# Patient Record
Sex: Female | Born: 1995 | Race: Black or African American | Hispanic: No | Marital: Single | Smoking: Never smoker
Health system: Southern US, Community
[De-identification: ages and names within clinical notes are randomized; demographics above are authoritative.]

## PROBLEM LIST (undated history)

## (undated) DIAGNOSIS — F419 Anxiety disorder, unspecified: Secondary | ICD-10-CM

## (undated) DIAGNOSIS — F32A Depression, unspecified: Secondary | ICD-10-CM

## (undated) DIAGNOSIS — F329 Major depressive disorder, single episode, unspecified: Secondary | ICD-10-CM

---

## 2015-09-04 ENCOUNTER — Emergency Department (HOSPITAL_COMMUNITY)
Admission: EM | Admit: 2015-09-04 | Discharge: 2015-09-04 | Disposition: A | Discharge status: Home / Self Care | Payer: Medicaid - Out of State | Attending: Emergency Medicine | Admitting: Emergency Medicine

## 2015-09-04 ENCOUNTER — Other Ambulatory Visit: Payer: Self-pay

## 2015-09-04 ENCOUNTER — Encounter (HOSPITAL_COMMUNITY): Payer: Self-pay | Admitting: Oncology

## 2015-09-04 DIAGNOSIS — R202 Paresthesia of skin: Secondary | ICD-10-CM | POA: Diagnosis not present

## 2015-09-04 DIAGNOSIS — R064 Hyperventilation: Secondary | ICD-10-CM | POA: Insufficient documentation

## 2015-09-04 DIAGNOSIS — F1210 Cannabis abuse, uncomplicated: Secondary | ICD-10-CM | POA: Diagnosis not present

## 2015-09-04 DIAGNOSIS — F329 Major depressive disorder, single episode, unspecified: Secondary | ICD-10-CM | POA: Insufficient documentation

## 2015-09-04 DIAGNOSIS — R42 Dizziness and giddiness: Secondary | ICD-10-CM | POA: Insufficient documentation

## 2015-09-04 DIAGNOSIS — F438 Other reactions to severe stress: Secondary | ICD-10-CM

## 2015-09-04 DIAGNOSIS — F4320 Adjustment disorder, unspecified: Secondary | ICD-10-CM | POA: Diagnosis not present

## 2015-09-04 DIAGNOSIS — F419 Anxiety disorder, unspecified: Secondary | ICD-10-CM

## 2015-09-04 DIAGNOSIS — Z008 Encounter for other general examination: Secondary | ICD-10-CM | POA: Diagnosis present

## 2015-09-04 LAB — CBC
HEMATOCRIT: 38.4 % (ref 36.0–46.0)
Hemoglobin: 13.1 g/dL (ref 12.0–15.0)
MCH: 30 pg (ref 26.0–34.0)
MCHC: 34.1 g/dL (ref 30.0–36.0)
MCV: 88.1 fL (ref 78.0–100.0)
Platelets: 226 10*3/uL (ref 150–400)
RBC: 4.36 MIL/uL (ref 3.87–5.11)
RDW: 12.5 % (ref 11.5–15.5)
WBC: 4.2 10*3/uL (ref 4.0–10.5)

## 2015-09-04 LAB — RAPID URINE DRUG SCREEN, HOSP PERFORMED
Amphetamines: NOT DETECTED
BARBITURATES: NOT DETECTED
BENZODIAZEPINES: NOT DETECTED
Cocaine: NOT DETECTED
Opiates: NOT DETECTED
Tetrahydrocannabinol: POSITIVE — AB

## 2015-09-04 LAB — COMPREHENSIVE METABOLIC PANEL
ALBUMIN: 4.3 g/dL (ref 3.5–5.0)
ALK PHOS: 41 U/L (ref 38–126)
ALT: 15 U/L (ref 14–54)
AST: 23 U/L (ref 15–41)
Anion gap: 5 (ref 5–15)
BILIRUBIN TOTAL: 0.6 mg/dL (ref 0.3–1.2)
BUN: 18 mg/dL (ref 6–20)
CALCIUM: 9.4 mg/dL (ref 8.9–10.3)
CO2: 25 mmol/L (ref 22–32)
CREATININE: 0.86 mg/dL (ref 0.44–1.00)
Chloride: 106 mmol/L (ref 101–111)
GFR calc Af Amer: >60 mL/min (ref >60)
GLUCOSE: 101 mg/dL — AB (ref 65–99)
Potassium: 4 mmol/L (ref 3.5–5.1)
Sodium: 136 mmol/L (ref 135–145)
TOTAL PROTEIN: 8.1 g/dL (ref 6.5–8.1)

## 2015-09-04 LAB — SALICYLATE LEVEL: Salicylate Lvl: <4 mg/dL (ref 2.8–30.0)

## 2015-09-04 LAB — ACETAMINOPHEN LEVEL: Acetaminophen (Tylenol), Serum: <10 ug/mL — ABNORMAL LOW (ref 10–30)

## 2015-09-04 LAB — ETHANOL
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
## 2015-09-04 NOTE — ED Notes (Signed)
Pt brought in by [employer] for medical clearance as pt is experiencing family stressors.  Pt reports that family is not supportive in many ways.  Denies SI/HI.  Pt is tearful in triage and reluctant to talk.  Per [employer] pt was hyperventilating in her dorm room and counselor suggested pt be brought in for psyc evaluation.

## 2015-09-04 NOTE — BHH Counselor (Signed)
Disposition: Per Hulan FessIjeoma Nwaeze, NP, Pt does not meet inpt criteria. Pt can be d/c with OP resources. Pt provided w/ info for [facility] and she says she is open to seeking tx.  Disposition shared with Earley FavorGail Schulz, NP and she is in agreement.   Cyndie MullAnna Scout Guyett, [facility] Triage Specialist

## 2015-09-04 NOTE — ED Provider Notes (Signed)
CSN: [id number]   Arrival date & time 09/04/15 1947  History  By signing my name below, I, Amber York, attest that this documentation has been prepared under the direction and in the presence of Earley Favor, FNP. Electronically Signed: Bethel York, ED Scribe. 09/04/2015. 10:02 PM. Chief Complaint  Patient presents with  . Medical Clearance    HPI The history is provided by the patient. No language interpreter was used.   Amber York is a 19 y.o. female who presents to the Emergency Department complaining of an anxiety attack today. Pt states that she woke up feeling dizzy with tingling in the hands/arms. She was found hyperventilating in her room by EMS. The episode lasted for 1.5 hours. She states that during the episode she was crying because she did not know what was going on. She spoke with a physician at the [employer] [facility] who referred her to the ED. She is in college and this is her first time away from home. Pt states "I don't like being away from home". She states that her family is frequently unavailable or unwilling to talk to her. Pt does not have friend's at school and is not in contact with friend's from home.  History reviewed. No pertinent past medical history.  History reviewed. No pertinent past surgical history.  No family history on file.  Social History  Substance Use Topics  . Smoking status: Never Smoker   . Smokeless tobacco: Never Used  . Alcohol Use: No     Review of Systems  Constitutional: Negative for fever and appetite change.  Respiratory: Negative for shortness of breath.   Cardiovascular: Negative for chest pain.  Musculoskeletal: Negative for arthralgias.  Neurological: Positive for dizziness. Negative for weakness and numbness.  Psychiatric/Behavioral: The patient is nervous/anxious.   All other systems reviewed and are negative.   Home Medications   Prior to Admission medications   Not on File    Allergies  Wheat bran and  Peanuts  Triage Vitals: BP 114/59 mmHg  Pulse 64  Temp(Src) 98.5 F (36.9 C) (Oral)  Resp 18  Ht  (1.676 m)  Wt 151 lb (68.493 kg)  BMI 24.38 kg/m2  SpO2 99%  LMP 09/02/2015 (Exact Date)  Physical Exam  Constitutional: She is oriented to person, place, and time. She appears well-developed and well-nourished. No distress.  Eyes: Pupils are equal, round, and reactive to light.  Neck: Normal range of motion.  Cardiovascular: Normal rate and regular rhythm.   Pulmonary/Chest: Effort normal.  Neurological: She is alert and oriented to person, place, and time.  Skin: Skin is warm.  Psychiatric: Her speech is normal. Judgment and thought content normal. Her mood appears anxious. Cognition and memory are normal. She exhibits a depressed mood.  Nursing note and vitals reviewed.   ED Course  Procedures  DIAGNOSTIC STUDIES: Oxygen Saturation is 99% on RA,  normal by my interpretation.    COORDINATION OF CARE: 10:01 PM Discussed treatment plan which includes lab work and TTS consult with pt at bedside and pt agreed to the plan.  Labs Review-  Labs Reviewed  COMPREHENSIVE METABOLIC PANEL - Abnormal; Notable for the following:    Glucose, Bld 101 (*)    All other components within normal limits  ACETAMINOPHEN LEVEL - Abnormal; Notable for the following:    Acetaminophen (Tylenol), Serum <10 (*)    All other components within normal limits  ETHANOL  SALICYLATE LEVEL  CBC  URINE RAPID DRUG SCREEN, HOSP PERFORMED  Imaging Review No results found.  MDM  Patient is homesick with very little contact with family in [place] states money is very tight and does not think she will be able to travel home for the holidays  Has been assessed by TTS and given outpatient resources I up dated Dr. Micki RileyBarnet as to plan she was most appreciative.  Final diagnoses:  None    I personally performed the services described in this documentation, which was scribed in my presence. The  recorded information has been reviewed and is accurate.     Earley FavorGail Antonios Ostrow, NP 09/04/15 2214  Earley FavorGail Atalia Litzinger, NP 09/04/15 [phone]  Doug SouSam Jacubowitz, MD 09/04/15 2325

## 2015-09-04 NOTE — Discharge Instructions (Signed)
Panic Attacks Panic attacks are sudden, short feelings of great fear or discomfort. You may have them for no reason when you are relaxed, when you are uneasy (anxious), or when you are sleeping.  HOME CARE  Take all your medicines as told.  Check with your doctor before starting new medicines.  Keep all doctor visits. GET HELP IF:  You are not able to take your medicines as told.  Your symptoms do not get better.  Your symptoms get worse. GET HELP RIGHT AWAY IF:  Your attacks seem different than your normal attacks.  You have thoughts about hurting yourself or others.  You take panic attack medicine and you have a side effect. MAKE SURE YOU:  Understand these instructions.  Will watch your condition.  Will get help right away if you are not doing well or get worse.   This information is not intended to replace advice given to you by your health care provider. Make sure you discuss any questions you have with your health care provider.   Document Released: 12/03/2010 Document Revised: 08/21/2013 Document Reviewed: 06/14/2013 Elsevier Interactive Patient Education [employer]vier Inc. You have been given a number of resources to follow up with at your school

## 2015-09-04 NOTE — ED Notes (Signed)
Dr. Electa SniffBarnett from [employer] would like to be updated on pt if possible.  She can be reached at [phone].

## 2017-02-23 ENCOUNTER — Encounter (HOSPITAL_COMMUNITY): Payer: Self-pay | Admitting: Emergency Medicine

## 2017-02-23 ENCOUNTER — Emergency Department (HOSPITAL_COMMUNITY)
Admission: EM | Admit: 2017-02-23 | Discharge: 2017-02-24 | Disposition: A | Discharge status: Other Acute Inpatient Hospital | Payer: Medicaid - Out of State | Attending: Emergency Medicine | Admitting: Emergency Medicine

## 2017-02-23 DIAGNOSIS — Z79899 Other long term (current) drug therapy: Secondary | ICD-10-CM | POA: Insufficient documentation

## 2017-02-23 DIAGNOSIS — F322 Major depressive disorder, single episode, severe without psychotic features: Secondary | ICD-10-CM | POA: Diagnosis present

## 2017-02-23 DIAGNOSIS — R45851 Suicidal ideations: Secondary | ICD-10-CM | POA: Diagnosis present

## 2017-02-23 DIAGNOSIS — Z91010 Allergy to peanuts: Secondary | ICD-10-CM | POA: Insufficient documentation

## 2017-02-23 HISTORY — DX: Anxiety disorder, unspecified: F41.9

## 2017-02-23 HISTORY — DX: Depression, unspecified: F32.A

## 2017-02-23 HISTORY — DX: Major depressive disorder, single episode, unspecified: F32.9

## 2017-02-23 LAB — I-STAT BETA HCG BLOOD, ED (MC, WL, AP ONLY)

## 2017-02-23 LAB — COMPREHENSIVE METABOLIC PANEL
ALBUMIN: 4.2 g/dL (ref 3.5–5.0)
ALT: 11 U/L — ABNORMAL LOW (ref 14–54)
ANION GAP: 6 (ref 5–15)
AST: 19 U/L (ref 15–41)
Alkaline Phosphatase: 54 U/L (ref 38–126)
BILIRUBIN TOTAL: 0.6 mg/dL (ref 0.3–1.2)
BUN: 9 mg/dL (ref 6–20)
CO2: 24 mmol/L (ref 22–32)
Calcium: 9.2 mg/dL (ref 8.9–10.3)
Chloride: 106 mmol/L (ref 101–111)
Creatinine, Ser: 0.76 mg/dL (ref 0.44–1.00)
GFR calc non Af Amer: >60 mL/min (ref >60)
GLUCOSE: 93 mg/dL (ref 65–99)
POTASSIUM: 3.6 mmol/L (ref 3.5–5.1)
SODIUM: 136 mmol/L (ref 135–145)
TOTAL PROTEIN: 7.7 g/dL (ref 6.5–8.1)

## 2017-02-23 LAB — RAPID URINE DRUG SCREEN, HOSP PERFORMED
AMPHETAMINES: NOT DETECTED
BARBITURATES: NOT DETECTED
Benzodiazepines: NOT DETECTED
Cocaine: NOT DETECTED
Opiates: NOT DETECTED
TETRAHYDROCANNABINOL: NOT DETECTED

## 2017-02-23 LAB — CBC
HEMATOCRIT: 35.7 % — AB (ref 36.0–46.0)
Hemoglobin: 11.8 g/dL — ABNORMAL LOW (ref 12.0–15.0)
MCH: 28.7 pg (ref 26.0–34.0)
MCHC: 33.1 g/dL (ref 30.0–36.0)
MCV: 86.9 fL (ref 78.0–100.0)
PLATELETS: 222 10*3/uL (ref 150–400)
RBC: 4.11 MIL/uL (ref 3.87–5.11)
RDW: 13.8 % (ref 11.5–15.5)
WBC: 4.8 10*3/uL (ref 4.0–10.5)

## 2017-02-23 LAB — ACETAMINOPHEN LEVEL: Acetaminophen (Tylenol), Serum: <10 ug/mL — ABNORMAL LOW (ref 10–30)

## 2017-02-23 LAB — SALICYLATE LEVEL

## 2017-02-23 LAB — ETHANOL: Alcohol, Ethyl (B): <5 mg/dL (ref <5)

## 2017-02-23 NOTE — ED Triage Notes (Signed)
Pt states she went to the mental health center at school and they advised her to come to the hospital. Pt states she was having suicidal ideations with no specific plan. "I feel like I just don't want to be here anymore". 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 Pt currently living in a dorm on campus. Pt denies any history of harming herself. Pt tearful during interview. Pt states she does not want any of her family members notified that she is in the hospital. Pt states she sees the counselor at the university every other week, but it is just not helping. Pt states she had no choice in coming to the hospital and she doesn't want to take any medicine.

## 2017-02-23 NOTE — ED Provider Notes (Signed)
[facility]-EMERGENCY DEPT Provider Note   CSN: [id number] Arrival date & time: 02/23/17  1932     History   Chief Complaint Chief Complaint  Patient presents with  . Medical Clearance    HPI Amber York is a 21 y.o. female.  The history is provided by the patient.  Mental Health Problem  Presenting symptoms: depression (1 year) and suicidal thoughts (without plan for 1 week)   Presenting symptoms: no suicidal threats and no suicide attempt   Degree of incapacity (severity):  Moderate Onset quality:  Gradual Timing:  Constant Progression:  Unchanged Chronicity:  New Context: not medication   Treatment compliance:  Untreated Relieved by:  Nothing Worsened by:  Nothing Ineffective treatments: achool counseling. Risk factors: no hx of mental illness and no recent psychiatric admission     Past Medical History:  Diagnosis Date  . Anxiety   . Depression     There are no active problems to display for this patient.   History reviewed. No pertinent surgical history.  OB History    No data available       Home Medications    Prior to Admission medications   Medication Sig Start Date End Date Taking? Authorizing Provider  cetirizine (ZYRTEC) 10 MG tablet Take 10 mg by mouth daily.   Yes Historical Provider, MD  hydrocortisone cream 0.5 % Apply 1 application topically 2 (two) times daily.   Yes Historical Provider, MD  triamcinolone (NASACORT) 55 MCG/ACT AERO nasal inhaler Place 2 sprays into the nose daily.   Yes Historical Provider, MD    Family History History reviewed. No pertinent family history.  Social History Social History  Substance Use Topics  . Smoking status: Never Smoker  . Smokeless tobacco: Never Used  . Alcohol use No     Allergies   Wheat bran; Other; and Peanuts [peanut oil]   Review of Systems Review of Systems  Psychiatric/Behavioral: Positive for suicidal ideas (without plan for 1 week).  All other systems reviewed and are  negative.    Physical Exam Updated Vital Signs BP 124/63 (BP Location: Right Arm)   Pulse 98   Temp 98.5 F (36.9 C) (Oral)   Resp 20   Ht  (1.676 m)   Wt 153 lb (69.4 kg)   LMP 01/29/2017   SpO2 99%   BMI 24.69 kg/m   Physical Exam  Constitutional: She is oriented to person, place, and time. She appears well-developed and well-nourished. No distress.  HENT:  Head: Normocephalic.  Nose: Nose normal.  Eyes: Conjunctivae are normal.  Neck: Neck supple. No tracheal deviation present.  Cardiovascular: Normal rate, regular rhythm and normal heart sounds.   Pulmonary/Chest: Effort normal and breath sounds normal. No respiratory distress.  Abdominal: Soft. She exhibits no distension.  Neurological: She is alert and oriented to person, place, and time.  Skin: Skin is warm and dry.  Psychiatric: She has a normal mood and affect.  Vitals reviewed.    ED Treatments / Results  Labs (all labs ordered are listed, but only abnormal results are displayed) Labs Reviewed  CBC - Abnormal; Notable for the following:       Result Value   Hemoglobin 11.8 (*)    HCT 35.7 (*)    All other components within normal limits  RAPID URINE DRUG SCREEN, HOSP PERFORMED  COMPREHENSIVE METABOLIC PANEL  ETHANOL  SALICYLATE LEVEL  ACETAMINOPHEN LEVEL  I-STAT BETA HCG BLOOD, ED ([facility], [facility], [facility] ONLY)    EKG  EKG  Interpretation None       Radiology No results found.  Procedures Procedures (including critical care time)  Medications Ordered in ED Medications - No data to display   Initial Impression / Assessment and Plan / ED Course  I have reviewed the triage vital signs and the nursing notes.  Pertinent labs & imaging results that were available during my care of the patient were reviewed by me and considered in my medical decision making (see chart for details).     21 year old female presents with suicidal thoughts over the last week. She does not have a plan. She has no  formal psychiatric history. 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 She has been doing outpatient therapy sessions with her school counselor and that has not been helping. She states that she has had about a year of depression symptoms but this seems to be worsening. TTS consulted to evaluate for safety. MEDICALLY CLEAR FOR TRANSFER OR PSYCHIATRIC ADMISSION.   Final Clinical Impressions(s) / ED Diagnoses   Final diagnoses:  Suicidal ideation    New Prescriptions New Prescriptions   No medications on file     Lyndal Pulley, MD 02/23/17 2117
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
## 2017-02-24 ENCOUNTER — Encounter (HOSPITAL_COMMUNITY): Payer: Self-pay | Admitting: *Deleted

## 2017-02-24 ENCOUNTER — Inpatient Hospital Stay (HOSPITAL_COMMUNITY)
Admission: AD | Admit: 2017-02-24 | Discharge: 2017-03-02 | DRG: 881 | Disposition: A | Discharge status: Home / Self Care | Payer: Medicaid - Out of State | Source: Intra-hospital | Attending: Psychiatry | Admitting: Psychiatry

## 2017-02-24 DIAGNOSIS — Z91018 Allergy to other foods: Secondary | ICD-10-CM

## 2017-02-24 DIAGNOSIS — Z598 Other problems related to housing and economic circumstances: Secondary | ICD-10-CM

## 2017-02-24 DIAGNOSIS — F329 Major depressive disorder, single episode, unspecified: Secondary | ICD-10-CM | POA: Diagnosis present

## 2017-02-24 DIAGNOSIS — R45851 Suicidal ideations: Secondary | ICD-10-CM | POA: Diagnosis present

## 2017-02-24 DIAGNOSIS — G4700 Insomnia, unspecified: Secondary | ICD-10-CM | POA: Diagnosis present

## 2017-02-24 DIAGNOSIS — Z818 Family history of other mental and behavioral disorders: Secondary | ICD-10-CM

## 2017-02-24 DIAGNOSIS — Z560 Unemployment, unspecified: Secondary | ICD-10-CM

## 2017-02-24 DIAGNOSIS — J302 Other seasonal allergic rhinitis: Secondary | ICD-10-CM | POA: Diagnosis present

## 2017-02-24 DIAGNOSIS — F322 Major depressive disorder, single episode, severe without psychotic features: Secondary | ICD-10-CM | POA: Diagnosis not present

## 2017-02-24 DIAGNOSIS — Z91010 Allergy to peanuts: Secondary | ICD-10-CM

## 2017-02-24 DIAGNOSIS — Z79899 Other long term (current) drug therapy: Secondary | ICD-10-CM | POA: Diagnosis not present

## 2017-02-24 DIAGNOSIS — F410 Panic disorder [episodic paroxysmal anxiety] without agoraphobia: Secondary | ICD-10-CM | POA: Diagnosis present

## 2017-02-24 MED ORDER — TRAZODONE HCL 50 MG PO TABS
50.0000 mg | ORAL_TABLET | Freq: Every evening | ORAL | Status: DC | PRN
  Administered 2017-02-28: 50 mg via ORAL
  Filled 2017-02-24: qty 1
  Filled 2017-02-24: qty 7

## 2017-02-24 MED ORDER — ALUM & MAG HYDROXIDE-SIMETH 200-200-20 MG/5ML PO SUSP: 30.0000 mL | ORAL | Status: DC | PRN

## 2017-02-24 MED ORDER — CITALOPRAM HYDROBROMIDE 10 MG PO TABS
10.0000 mg | ORAL_TABLET | Freq: Every day | ORAL | Status: DC
Start: 2017-02-24 — End: 2017-02-24
  Administered 2017-02-24: 10 mg via ORAL
  Filled 2017-02-24: qty 1

## 2017-02-24 MED ORDER — CITALOPRAM HYDROBROMIDE 10 MG PO TABS
10.0000 mg | ORAL_TABLET | Freq: Every day | ORAL | Status: DC
  Filled 2017-02-24 (×3): qty 1

## 2017-02-24 MED ORDER — MAGNESIUM HYDROXIDE 400 MG/5ML PO SUSP: 30.0000 mL | Freq: Every day | ORAL | Status: DC | PRN

## 2017-02-24 MED ORDER — ACETAMINOPHEN 325 MG PO TABS
650.0000 mg | ORAL_TABLET | Freq: Four times a day (QID) | ORAL | Status: DC | PRN
  Administered 2017-02-26 – 2017-02-27 (×2): 650 mg via ORAL
  Filled 2017-02-24 (×2): qty 2

## 2017-02-24 NOTE — ED Notes (Signed)
Attempted to call nursing report to [facility] Adult unit. Reports nurse will call back,.

## 2017-02-24 NOTE — ED Notes (Signed)
[facility] transport on unit to transfer pt to [facility] Adult unit per MD order. Pt signed for personal property and property given to [facility] transport for transfer. Pt signed e-signature. Ambulatory off unit. 

## 2017-02-24 NOTE — ED Notes (Signed)
Pt admitted to room #39. Pt presents with restricted affect, forwards little with this nurse. Pt endorsing passive SI, pt verbally contracts for safety. Pt denies HI. Denies AVH. "I haven't been feeling well." Pt tearful at times. Encourgement and support provided. Special checks q 15 mins in place for safety. Video monitoring in place. Will continue to monitor.

## 2017-02-24 NOTE — Progress Notes (Signed)
02/24/17 1258:  LRT went to pt room to offer activities.  Pt was tearful and not very social.  Pt stated she wanted some coloring sheets and word searches.  LRT printed out the word searches and coloring sheets for pt and gave them to her.  Caroll Rancher, LRT/CTRS
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
## 2017-02-24 NOTE — ED Notes (Signed)
Pt resting comfortably at this time. NAD noted.  

## 2017-02-24 NOTE — BH Assessment (Signed)
[facility] Assessment Progress Note  Per Thedore Mins, MD, this pt requires psychiatric hospitalization at this time.  Malva Limes, RN, [facility] has tentatively assigned pt to [facility] Rm 405-1; she will call with updates when they are ready to receive pt.  Pt has signed Voluntary Admission and Consent for Treatment, as well as Consent to Release Information to the [facility] [employer] [facility], and a notification call has been placed.  Signed forms have been faxed to [facility].  Pt's nurse has been notified, and agrees to send original paperwork along with pt via Juel Burrow, and to call report to [phone] when the time comes.  Doylene Canning, MA Triage Specialist [phone]

## 2017-02-24 NOTE — Progress Notes (Signed)
  Amber York did not attend wrap up group.

## 2017-02-24 NOTE — ED Notes (Signed)
[facility] called for transport. 

## 2017-02-24 NOTE — Consult Note (Signed)
[facility] Face-to-Face Psychiatry Consult   Reason for Consult:  Depression, wrote a suicide note Referring Physician:  EDP Patient Identification: Amber York MRN:  [id number] Principal Diagnosis: Major depressive disorder, single episode, severe without psychotic features [facility]) Diagnosis:   Patient Active Problem List   Diagnosis Date Noted  . Major depressive disorder, single episode, severe without psychotic features ([place]) [F32.2] 02/24/2017    Total Time spent with patient: 30 minutes  Subjective:   Amber York is a 21 y.o. female patient admitted with worsening depression.  HPI:  Amber York, 21 yo female, single, [employer] student had been depressed for a year.  She first began seeing a counselor at school Jan of this year.  She minimized her depression , feelings of hopelessness, attributing depression from stressors at school but otherwise non specific and vague stressors.  She wrote a text to her boyfriend that "I won't be living much loner."  Also, room mates began seeing a change in her.    When she was seen this am, she deferred on taking anti depressants and thinking that she did not need them.  She was also less receptive when she was told that inpatient stabilization would be of great benefit to her.    Past Psychiatric History:  See HPI  Risk to Self: Suicidal Ideation: Yes-Currently Present Suicidal Intent: No Is patient at risk for suicide?: Yes Suicidal Plan?: No Access to Means: No What has been your use of drugs/alcohol within the last 12 months?: Pt denies How many times?: 0 Other Self Harm Risks: None identified Triggers for Past Attempts: None known Intentional Self Injurious Behavior: None Risk to Others: Homicidal Ideation: No Thoughts of Harm to Others: No Current Homicidal Intent: No Current Homicidal Plan: No Access to Homicidal Means: No Identified Victim: None History of harm to others?: No Assessment of Violence: None Noted Violent Behavior  Description: Pt denies history of violence Does patient have access to weapons?: No Criminal Charges Pending?: No Does patient have a court date: No Prior Inpatient Therapy: Prior Inpatient Therapy: No Prior Therapy Dates: NA Prior Therapy Facilty/Provider(s): NA Reason for Treatment: NA Prior Outpatient Therapy: Prior Outpatient Therapy: Yes Prior Therapy Dates: Current Prior Therapy Facilty/Provider(s): [facility] Prescott Parma at [facility] Reason for Treatment: Depression Does patient have an ACCT team?: No Does patient have Intensive In-House Services?  : No Does patient have [facility] services? : No Does patient have P4CC services?: No  Past Medical History:  Past Medical History:  Diagnosis Date  . Anxiety   . Depression    History reviewed. No pertinent surgical history. Family History: History reviewed. No pertinent family history. Family Psychiatric  History: see HPI Social History:  History  Alcohol Use No     History  Drug Use No    Social History   Social History  . Marital status: Single    Spouse name: N/A  . Number of children: N/A  . Years of education: N/A   Social History Main Topics  . Smoking status: Never Smoker  . Smokeless tobacco: Never Used  . Alcohol use No  . Drug use: No  . Sexual activity: No   Other Topics Concern  . None   Social History Narrative  . None   Additional Social History:    Allergies:   Allergies  Allergen Reactions  . Wheat Bran Hives and Shortness Of Breath  . Other     Pea's, hives/sob  . Peanuts [Peanut Oil] Hives    Labs:  Results  for orders placed or performed during the hospital encounter of 02/23/17 (from the past 48 hour(s))  Comprehensive metabolic panel     Status: Abnormal   Collection Time: 02/23/17  8:18 PM  Result Value Ref Range   Sodium 136 135 - 145 mmol/L   Potassium 3.6 3.5 - 5.1 mmol/L   Chloride 106 101 - 111 mmol/L   CO2 24 22 - 32 mmol/L   Glucose, Bld 93 65 - 99 mg/dL   BUN 9 6 - 20  mg/dL   Creatinine, Ser 0.76 0.44 - 1.00 mg/dL   Calcium 9.2 8.9 - 10.3 mg/dL   Total Protein 7.7 6.5 - 8.1 g/dL   Albumin 4.2 3.5 - 5.0 g/dL   AST 19 15 - 41 U/L   ALT 11 (L) 14 - 54 U/L   Alkaline Phosphatase 54 38 - 126 U/L   Total Bilirubin 0.6 0.3 - 1.2 mg/dL   GFR calc non Af Amer >60 >60 mL/min   GFR calc Af Amer >60 >60 mL/min    Comment: (NOTE) The eGFR has been calculated using the CKD EPI equation. This calculation has not been validated in all clinical situations. eGFR's persistently <60 mL/min signify possible Chronic Kidney Disease.    Anion gap 6 5 - 15  Ethanol     Status: None   Collection Time: 02/23/17  8:18 PM  Result Value Ref Range   Alcohol, Ethyl (B) <5 <5 mg/dL    Comment:        LOWEST DETECTABLE LIMIT FOR SERUM ALCOHOL IS 5 mg/dL FOR MEDICAL PURPOSES ONLY   Salicylate level     Status: None   Collection Time: 02/23/17  8:18 PM  Result Value Ref Range   Salicylate Lvl <3.2 2.8 - 30.0 mg/dL  Acetaminophen level     Status: Abnormal   Collection Time: 02/23/17  8:18 PM  Result Value Ref Range   Acetaminophen (Tylenol), Serum <10 (L) 10 - 30 ug/mL    Comment:        THERAPEUTIC CONCENTRATIONS VARY SIGNIFICANTLY. A RANGE OF 10-30 ug/mL MAY BE AN EFFECTIVE CONCENTRATION FOR MANY PATIENTS. HOWEVER, SOME ARE BEST TREATED AT CONCENTRATIONS OUTSIDE THIS RANGE. ACETAMINOPHEN CONCENTRATIONS >150 ug/mL AT 4 HOURS AFTER INGESTION AND >50 ug/mL AT 12 HOURS AFTER INGESTION ARE OFTEN ASSOCIATED WITH TOXIC REACTIONS.   cbc     Status: Abnormal   Collection Time: 02/23/17  8:18 PM  Result Value Ref Range   WBC 4.8 4.0 - 10.5 K/uL   RBC 4.11 3.87 - 5.11 MIL/uL   Hemoglobin 11.8 (L) 12.0 - 15.0 g/dL   HCT 35.7 (L) 36.0 - 46.0 %   MCV 86.9 78.0 - 100.0 fL   MCH 28.7 26.0 - 34.0 pg   MCHC 33.1 30.0 - 36.0 g/dL   RDW 13.8 11.5 - 15.5 %   Platelets 222 150 - 400 K/uL  Rapid urine drug screen (hospital performed)     Status: None   Collection Time:  02/23/17  8:18 PM  Result Value Ref Range   Opiates NONE DETECTED NONE DETECTED   Cocaine NONE DETECTED NONE DETECTED   Benzodiazepines NONE DETECTED NONE DETECTED   Amphetamines NONE DETECTED NONE DETECTED   Tetrahydrocannabinol NONE DETECTED NONE DETECTED   Barbiturates NONE DETECTED NONE DETECTED    Comment:        DRUG SCREEN FOR MEDICAL PURPOSES ONLY.  IF CONFIRMATION IS NEEDED FOR ANY PURPOSE, NOTIFY LAB WITHIN 5 DAYS.  LOWEST DETECTABLE LIMITS FOR URINE DRUG SCREEN Drug Class       Cutoff (ng/mL) Amphetamine      1000 Barbiturate      200 Benzodiazepine   250 Tricyclics       037 Opiates          300 Cocaine          300 THC              50   I-Stat Beta hCG blood, ED ([facility], [facility], [facility] only)     Status: None   Collection Time: 02/23/17  8:34 PM  Result Value Ref Range   I-stat hCG, quantitative <5.0 <5 mIU/mL   Comment 3            Comment:   GEST. AGE      CONC.  (mIU/mL)   <=1 WEEK        5 - 50     2 WEEKS       50 - 500     3 WEEKS       100 - 10,000     4 WEEKS     1,000 - 30,000        FEMALE AND NON-PREGNANT FEMALE:     LESS THAN 5 mIU/mL     Current Facility-Administered Medications  Medication Dose Route Frequency Provider Last Rate Last Dose  . citalopram (CELEXA) tablet 10 mg  10 mg Oral Daily Corena Pilgrim, MD   10 mg at 02/24/17 1244   Current Outpatient Prescriptions  Medication Sig Dispense Refill  . cetirizine (ZYRTEC) 10 MG tablet Take 10 mg by mouth daily.    . hydrocortisone cream 0.5 % Apply 1 application topically 2 (two) times daily.    Marland Kitchen triamcinolone (NASACORT) 55 MCG/ACT AERO nasal inhaler Place 2 sprays into the nose daily.      Musculoskeletal: Strength & Muscle Tone: within normal limits Gait & Station: normal Patient leans: N/A  Psychiatric Specialty Exam: Physical Exam  Nursing note and vitals reviewed.   ROS  Blood pressure 125/75, pulse (!) 54, temperature 98.5 F (36.9 C), temperature source Oral, resp. rate 17,  height _0  (1.676 m), weight 69.4 kg (153 lb), last menstrual period 01/29/2017, SpO2 98 %.Body mass index is 24.69 kg/m.  General Appearance: Casual  Eye Contact:  Poor  Speech:  Slow  Volume:  Decreased  Mood:  Anxious, Depressed and Hopeless  Affect:  Constricted, Depressed and Flat  Thought Process:  Linear  Orientation:  Full (Time, Place, and Person)  Thought Content:  Rumination  Suicidal Thoughts:  No  Homicidal Thoughts:  No  Memory:  Immediate;   Fair Recent;   Fair Remote;   Fair  Judgement:  Impaired  Insight:  Shallow  Psychomotor Activity:  Normal  Concentration:  Concentration: Fair and Attention Span: Fair  Recall:  [employer] of Knowledge:  Fair  Language:  Fair  Akathisia:  No  Handed:  Right  AIMS (if indicated):     Assets:  Resilience Social Support Transportation  ADL's:  Intact  Cognition:  WNL  Sleep:      Treatment Plan Summary: Daily contact with patient to assess and evaluate symptoms and progress in treatment, Medication management and Plan admit to [facility]  Disposition: Recommend psychiatric Inpatient admission when medically cleared. Supportive therapy provided about ongoing stressors. [facility] 405 bed [place], NP [facility] 02/24/2017 2:39 PM

## 2017-02-24 NOTE — Progress Notes (Signed)
Patient ID: Amber York, female   DOB: June 28, 1996, 21 y.o.   MRN: [id number]  D: Patient was admitted today. In bed when approaching. Woke up briefly reporting increased depression recently but denies any SI at present. Encouraged to get sleep medication if she needs it tonight. A: Staff will monitor on q 15 minute checks and follow medication/ treatment orders. R: No medications given at this time.

## 2017-02-24 NOTE — Tx Team (Signed)
Initial Treatment Plan 02/24/2017 6:33 PM [name] ZOX:[id number]    PATIENT STRESSORS: Educational concerns Financial difficulties Health problems   PATIENT STRENGTHS: Ability for insight Active sense of humor   PATIENT IDENTIFIED PROBLEMS: Depression  uicidal Ideation            " I'm a good student"  " I want to go back to school"     DISCHARGE CRITERIA:  Ability to meet basic life and health needs Adequate post-discharge living arrangements Improved stabilization in mood, thinking, and/or behavior  PRELIMINARY DISCHARGE PLAN: Attend aftercare/continuing care group Attend PHP/IOP  PATIENT/FAMILY INVOLVEMENT: This treatment plan has been presented to and reviewed with the patient, Amber York, and/or family member, .  The patient and family have been given the opportunity to ask questions and make suggestions.  [name], RN 02/24/2017, 6:33 PM

## 2017-02-25 DIAGNOSIS — Z79899 Other long term (current) drug therapy: Secondary | ICD-10-CM

## 2017-02-25 DIAGNOSIS — Z818 Family history of other mental and behavioral disorders: Secondary | ICD-10-CM

## 2017-02-25 DIAGNOSIS — F322 Major depressive disorder, single episode, severe without psychotic features: Secondary | ICD-10-CM

## 2017-02-25 DIAGNOSIS — R45851 Suicidal ideations: Secondary | ICD-10-CM

## 2017-02-25 MED ORDER — BUPROPION HCL 75 MG PO TABS
150.0000 mg | ORAL_TABLET | Freq: Two times a day (BID) | ORAL | Status: DC
  Filled 2017-02-25 (×8): qty 2

## 2017-02-25 NOTE — [facility] Suicide Risk Assessment (Signed)
[facility] Admission Suicide Risk Assessment   Nursing information obtained from:    Demographic factors:    Current Mental Status:    Loss Factors:    Historical Factors:    Risk Reduction Factors:     Total Time spent with patient: 45 minutes Principal Problem: MDD (major depressive disorder) Diagnosis:   Patient Active Problem List   Diagnosis Date Noted  . 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 Referred by her Counsellor. She reports feeling depressed and suicidal. Reports crying spells, low energy levels, lack of motivation, loss of joy in past interest. She sent a text message to her friend expressing that she was going to die soon. At interview, states she has been feeling depressed for the past six months. No known precipitant. Last relationship was last summer. Good relationship with her parents. Says she has been coping well academically. No legal issues. [place]oved from [place] area. No support structures here.  No illicit substance use. No alcohol use. No past suicidal behavior. Never been on psychotropic medications in the past. No past mental health admission. No access to weapons.  We discussed use of Bupropion as an antidepressant. We explored the risks and benefits. Patient consented to treatment.    Continued Clinical Symptoms:  Alcohol Use Disorder Identification Test Final Score (AUDIT): 0 The "Alcohol Use Disorders Identification Test", Guidelines for Use in Primary Care, Second Edition.  World Science writer [facility]). Score between 0-7:  no or low risk or alcohol related problems. Score between 8-15:  moderate risk of alcohol related problems. Score between 16-19:  high risk of alcohol related problems. Score 20 or above:  warrants further diagnostic evaluation for alcohol dependence and  treatment.   CLINICAL FACTORS:   Depression   Musculoskeletal: Strength & Muscle Tone: within normal limits Gait & Station: normal Patient leans: N/A  Psychiatric Specialty Exam: Physical Exam  Constitutional: She is oriented to person, place, and time. She appears well-developed and well-nourished.  HENT:  Head: Normocephalic and atraumatic.  Eyes: Conjunctivae and EOM are normal. Pupils are equal, round, and reactive to light.  Neck: Normal range of motion. Neck supple.  Cardiovascular: Normal rate and regular rhythm.   Respiratory: Breath sounds normal.  GI: Soft. Bowel sounds are normal.  Musculoskeletal: Normal range of motion.  Neurological: She is alert and oriented to person, place, and time. She has normal reflexes.  Skin: Skin is warm and dry.  Psychiatric:  As above     ROS  Blood pressure 118/72, pulse 82, temperature 98.7 F (37.1 C), temperature source Oral, resp. rate 20, height  (1.676 m), weight 69.4 kg (153 lb), last menstrual period 01/29/2017, SpO2 99 %.Body mass index is 24.69 kg/m.  General Appearance: Casually dressed. Not crisply groomed. Withdrawn, moderate rapport. Not internally distracted.   Eye Contact:  Fair  Speech:  Slow  Volume:  Decreased  Mood:  Depressed  Affect:  Blunted and mood congruent  Thought Process:  Linear  Orientation:  Full (Time, Place, and Person)  Thought Content:  Negative view of self, catastrophizing.   Suicidal Thoughts:  Off and on. No specific plans.   Homicidal Thoughts:  No  Memory:  Immediate;   Fair Recent;   Fair Remote;   Fair  Judgement:  Intact  Insight:  Good  Psychomotor Activity:  Decreased  Concentration:  Concentration:  Fair and Attention Span: Fair  Recall: [employer] of Knowledge:  Good  Language:  Good  Akathisia:  No  Handed:    AIMS (if indicated):     Assets:  Desire for Improvement Financial Resources/Insurance Housing Physical Health  ADL's:  Impaired  Cognition:  WNL   Sleep:         COGNITIVE FEATURES THAT CONTRIBUTE TO RISK:  None    SUICIDE RISK:   Moderate:  Frequent suicidal ideation with limited intensity, and duration, some specificity in terms of plans, no associated intent, good self-control, limited dysphoria/symptomatology, some risk factors present, and identifiable protective factors, including available and accessible social support.  PLAN OF CARE:  1. Wellbutrin 150 mg BID  I certify that inpatient services furnished can reasonably be expected to improve the patient's condition.   Georgiann Cocker, MD 02/25/2017, 2:12 PM
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
## 2017-02-25 NOTE — [facility] Group Notes (Signed)
[facility] Group Notes: (Clinical Social Work)   02/25/2017      Type of Therapy:  Group Therapy   Participation Level:  Did Not Attend despite MHT prompting   Herson Prichard Grossman-Orr, LCSW 02/25/2017, 1:49 PM      
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
## 2017-02-25 NOTE — Progress Notes (Signed)
[facility] Group Notes:  (Nursing/MHT/Case Management/Adjunct)  Date:  02/25/2017  Time:  2100 Type of Therapy:  wrap up group  Participation Level:  Active  Participation Quality:  Appropriate, Drowsy, Sharing and Supportive  Affect:  Depressed  Cognitive:  Appropriate  Insight:  Lacking  Engagement in Group:  Engaged  Modes of Intervention:  Clarification, Education and Support  Summary of Progress/Problems: Pt shared that she enjoyed being able to go outside today. Pt reports feeling alone and hopeless and when asked if she considered her friends from school as supportive pt shared that she wasn't sure about that. Pt reports mother being supportive but pt is away at school. Pt is resistant or concerned about taking medicine but does feel that her aloneness and hopelessness may be stemming from depression.   Amber York 02/25/2017, 10:58 PMBHH Group Notes:  (Nursing/MHT/Case Management/Adjunct)

## 2017-02-25 NOTE — Progress Notes (Signed)
Amber York is seen OOB UAL on the 400 hall today...she is quiet, isolative, keeps to herself, but she does make better eye contact today and she attends both of her groups and stays for the entire groups!Marland Kitchen A She is obviously uncomfortable, quiet in the dayroom, not talking to anybody but engaged in the " group" conversations and when she is asked to comment in her Life SKills group, she does so. She completed her daily assessment and on this she wrote she denied SI today and she rated her depression, hopelessness and anxiety  " 6/6/7", respectively. She did not want to take the po celexa that was ordered for her   And she is scheduled  To begin po wellbutrin tomorrow. R Safety is in place and staff to cont to establish therapeutic relationship.

## 2017-02-26 DIAGNOSIS — F329 Major depressive disorder, single episode, unspecified: Principal | ICD-10-CM

## 2017-02-26 LAB — LIPID PANEL
Cholesterol: 156 mg/dL (ref 0–200)
HDL: 55 mg/dL (ref >40)
LDL CALC: 90 mg/dL (ref 0–99)
TRIGLYCERIDES: 53 mg/dL (ref <150)
Total CHOL/HDL Ratio: 2.8 RATIO
VLDL: 11 mg/dL (ref 0–40)

## 2017-02-26 LAB — TSH: TSH: 1.176 u[IU]/mL (ref 0.350–4.500)

## 2017-02-26 NOTE — Progress Notes (Signed)
[facility] MD Progress Note  02/26/2017 12:27 PM Amber York  MRN:  [id number] Subjective:   Patient reports " I am okay, I just feel alone all of the time." Reports I am not interested in taking medications.  Objective:Amber York is awake, alert and oriented *3. Seen resting in dayroom. Patient continues to present flat and guarded.  Denies suicidal or homicidal ideation. Denies auditory or visual hallucination and does not appear to be responding to internal stimuli. Patient reports he is medication compliant without mediation side effects.  States her depression 2/10. Patient reports always feeling alone. Patient reports she has been attending group session. Patient is requesting to be discharge soon. Support, encouragement and reassurance was provided.   Objective: Principal Problem: MDD (major depressive disorder) Diagnosis:   Patient Active Problem List   Diagnosis Date Noted  . 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 MDD (major depressive disorder) [F32.9] 02/24/2017   Total Time spent with patient: 30 minutes  Past Psychiatric History:   Past Medical History:  Past Medical History:  Diagnosis Date  . Anxiety   . Depression    History reviewed. No pertinent surgical history. Family History: History reviewed. No pertinent family history. Family Psychiatric  History:  Social History:  History  Alcohol Use No     History  Drug Use No    Social History   Social History  . Marital status: Single    Spouse name: N/A  . Number of children: N/A  . Years of education: N/A   Social History Main Topics  . Smoking status: Never Smoker  . Smokeless tobacco: Never Used  . Alcohol use No  . Drug use: No  . Sexual activity: No   Other Topics Concern  . None   Social History Narrative  . None   Additional Social History:    Pain Medications: n/a History of alcohol / drug use?: No history of alcohol /  drug abuse Negative Consequences of Use: Legal, Personal relationships Withdrawal Symptoms: Agitation                    Sleep: Fair  Appetite:  Fair  Current Medications: Current Facility-Administered Medications  Medication Dose Route Frequency Provider Last Rate Last Dose  . acetaminophen (TYLENOL) tablet 650 mg  650 mg Oral Q6H PRN Adonis Brook, NP      . alum & mag hydroxide-simeth (MAALOX/MYLANTA) 200-200-20 MG/5ML suspension 30 mL  30 mL Oral Q4H PRN Adonis Brook, NP      . buPROPion [facility]) tablet 150 mg  150 mg Oral BID Georgiann Cocker, MD      . magnesium hydroxide (MILK OF MAGNESIA) suspension 30 mL  30 mL Oral Daily PRN Adonis Brook, NP      . traZODone (DESYREL) tablet 50 mg  50 mg Oral QHS PRN Adonis Brook, NP        Lab Results:  Results for orders placed or performed during the hospital encounter of 02/24/17 (from the past 48 hour(s))  TSH     Status: None   Collection Time: 02/26/17  6:21 AM  Result Value Ref Range   TSH 1.176 0.350 - 4.500 uIU/mL    Comment: Performed by a 3rd Generation assay with a functional sensitivity of <=0.01 uIU/mL. Performed at [facility], [address] [place], [place] [address]   Lipid panel     Status: None   Collection Time: 02/26/17  6:21 AM  Result Value Ref Range   Cholesterol 156 0 - 200 mg/dL   Triglycerides 53 <161 mg/dL   HDL 55 >09 mg/dL   Total CHOL/HDL Ratio 2.8 RATIO   VLDL 11 0 - 40 mg/dL   LDL Cholesterol 90 0 - 99 mg/dL    Comment:        Total Cholesterol/HDL:CHD Risk Coronary Heart Disease Risk Table                     Men   Women  1/2 Average Risk   3.4   3.3  Average Risk       5.0   4.4  2 X Average Risk   9.6   7.1  3 X Average Risk  23.4   11.0        Use the calculated Patient Ratio above and the CHD Risk Table to determine the patient's CHD Risk.        ATP III CLASSIFICATION (LDL):  <100     mg/dL   Optimal  604-540  mg/dL   Near or Above                     Optimal  130-159  mg/dL   Borderline  981-191  mg/dL   High  >478     mg/dL   Very High Performed at [facility], [address] [place], [place] [address]     Blood Alcohol level:  Lab Results  Component Value Date   ETH <5 02/23/2017   ETH <5 09/04/2015    Metabolic Disorder Labs: No results found for: HGBA1C, MPG No results found for: PROLACTIN Lab Results  Component Value Date   CHOL 156 02/26/2017   TRIG 53 02/26/2017   HDL 55 02/26/2017   CHOLHDL 2.8 02/26/2017   VLDL 11 02/26/2017   LDLCALC 90 02/26/2017    Physical Findings: AIMS: Facial and Oral Movements Muscles of Facial Expression: None, normal Lips and Perioral Area: None, normal Jaw: None, normal Tongue: None, normal,Extremity Movements Upper (arms, wrists, hands, fingers): None, normal Lower (legs, knees, ankles, toes): None, normal, Trunk Movements Neck, shoulders, hips: None, normal, Overall Severity Severity of abnormal movements (highest score from questions above): None, normal Incapacitation due to abnormal movements: None, normal Patient's awareness of abnormal movements (rate only patient's report): No Awareness, Dental Status Current problems with teeth and/or dentures?: No Does patient usually wear dentures?: No  CIWA:  CIWA-Ar Total: 0 COWS:  COWS Total Score: 0  Musculoskeletal: Strength & Muscle Tone: within normal limits Gait & Station: normal Patient leans: N/A  Psychiatric Specialty Exam: Physical Exam  ROS  Blood pressure 111/70, pulse 73, temperature 98.7 F (37.1 C), temperature source Oral, resp. rate 16, height  (1.676 m), weight 69.4 kg (153 lb), last menstrual period 01/29/2017, SpO2 99 %.Body mass index is 24.69 kg/m.  General Appearance: Casual  Eye Contact:  Fair  Speech:  Clear and Coherent  Volume:  Normal  Mood:  Anxious  Affect:  Congruent  Thought Process:  Coherent  Orientation:  Full (Time, Place, and Person)  Thought Content:   Hallucinations: None  Suicidal Thoughts:  No  Homicidal Thoughts:  No  Memory:  Immediate;   Fair Recent;   Poor Remote;   Fair  Judgement:  Intact  Insight:  Shallow  Psychomotor Activity:  Normal  Concentration:  Concentration: Fair  Recall:  [employer] of Knowledge:  Fair  Language:  Good  Akathisia:  No  Handed:  Right  AIMS (if indicated):     Assets:  Communication Skills Desire for Improvement Resilience Social Support  ADL's:  Intact  Cognition:  WNL  Sleep:  Number of Hours: 6.75    I agree with current treatment plan on 02/26/2017, Patient seen face-to-face for psychiatric evaluation follow-up, chart reviewed. Treatment team. Reviewed the information documented and agree with the treatment plan.   Treatment Plan Summary: Daily contact with patient to assess and evaluate symptoms and progress in treatment and Medication management  1. Patient was admitted to the adult unit at [facility] under the service of Dr. Jama Flavors. 2.  Routine labs, which include CBC, CMP, UDS, UA, and medical consultation were reviewed and routine PRN's were ordered for the patient. 3. Will maintain Q 15 minutes observation for safety.  Estimated LOS:  3-5 days 4. During this hospitalization the patient will receive psychosocial  Assessment. 5. Patient will participate in  group, milieu, and family therapy. Psychotherapy: Social and Doctor, hospital, learning based strategies, cognitive behavioral, and family object relations individuation separation intervention psychotherapies can be considered.  6. To reduce current symptoms to base line and improve the patient's overall level of functioning will adjust Medication management as follow: 7. Haylie Mccutcheon initially declined medications and then said she would consider it. Will re-assess her decision. If she decides to start medication will recommend Wellbutrin 150 mg  to help with anxiety, social anxiety and  depression. Also discussed Lexapro both being on low side effect profile. 02/26/2017 patient is refusing antidepressant at this time. 8.  Patient has been receiving counseling since January with minimal effect. She is encouraged to start psychotherapeutic medications to help with her depression, anxiety and suicidal thoughts.   9. Will continue to monitor patient's mood and behavior. 10. Social Work will schedule a Family meeting to obtain collateral information and discuss discharge and follow up plan.  Discharge concerns will also be addressed:  Safety, stabilization, and access to medication 11. This visit was of moderate complexity. It exceeded 30 minutes and 50% of this visit was spent in discussing coping mechanisms, patient's social situation, reviewing records from and  contacting family to get consent for medication and also discussing patient's presentation and obtaining history.  Oneta Rack, NP 02/26/2017, 12:27 PM

## 2017-02-26 NOTE — B[facility] Group Notes (Signed)

## 2017-02-26 NOTE — Progress Notes (Signed)
Wrier spoke with patient 1:1 and she reports having had a good day. She was observed sitting in the dayroom coloring but not really seen interacting with others in the dayroom. She did request a handout to read up on concerning the new medications she will be starting on tomorrow. She was informed of medications available if needed. Support given and safety maintained on unit with 15 min checks.

## 2017-02-26 NOTE — B[facility] Group Notes (Signed)
[facility] Group Notes:  (Clinical Social Work)   02/26/2017    11:00AM-12:00PM  Summary of Progress/Problems:   The main focus of today's process group was to              1)  discuss the importance of adding appropriate supports to help in recovery goals             2)  identify the patient's current healthy supports             3)  talk about patients' current unhealthy supports and plan how to handle them             4)  Illustrate need for self support  An emphasis was placed on using professional supports as part of the plan to stay well and work on recovery.  The patient expressed full comprehension of the concepts presented.  The patient stated her mother is going to be a healthy support for her when she leaves the hospital.  She added to the conversation several times in an appropriate manner.  Type of Therapy:  Process Group with Motivational Interviewing  Participation Level:  Active  Participation Quality:  Sharing and Supportive  Affect:  Blunted and Depressed  Cognitive:  Appropriate  Insight:  Engaged  Engagement in Therapy:  Engaged  Modes of Intervention:   Education, Support and [employer], LCSW 02/26/2017    1:21 PM

## 2017-02-26 NOTE — Progress Notes (Signed)
Adult Psychoeducational Group Note  Date:  02/26/2017 Time:  10:20 PM  Group Topic/Focus:  Wrap-Up Group:   The focus of this group is to help patients review their daily goal of treatment and discuss progress on daily workbooks.  Participation Level:  Minimal  Participation Quality:  Appropriate  Affect:  Flat  Cognitive:  Appropriate  Insight: Appropriate  Engagement in Group:  Engaged  Modes of Intervention:  Socialization and Support  Additional Comments:  Patient attended and participated in group tonight. She reports that her day today was better than yesterday. Today she went for her meals attended her groups and socialized. She stayed outside of her room into the dayroom.  Amber York [facility] 02/26/2017, 10:20 PM

## 2017-02-26 NOTE — Progress Notes (Signed)
   Date:  02/25/2017 Time:  0930  Group Topic/Focus:  Goals Group:   The focus of this group is to help patients establish daily goals to achieve during treatment and discuss how the patient can incorporate goal setting into their daily lives to aide in recovery.  Participation Level:  Minimal  Participation Quality:  Attentive  Affect:  Anxious  Cognitive:  Alert  Insight: Limited  Engagement in Group:  Engaged  Modes of Intervention:  Education  Additional Comments:    Amber York 02/26/2017, 9:16 AM

## 2017-02-26 NOTE — Progress Notes (Signed)
Writer spoke with patient at beginning of shift and reports having had a good day. Writer spoke with her about her decision to not take medication and she reports that she feels that if she is not going to commit to it once discharged she doesn't see the need in taking it while here. She reports that she doesn't have really anyone to talk to about her feeling and writer encouraged her to speak with an older sibling or mother. She reports not feeling comfortable confiding in any of her friends. Writer encouraged her to talk with her social worker about resources available. She has been up in the dayroom but not really socializing with other patients. Safety maintained on unit with 15 min checks.

## 2017-02-27 LAB — HEMOGLOBIN A1C
HEMOGLOBIN A1C: 5.1 % (ref 4.8–5.6)
Mean Plasma Glucose: 100 mg/dL

## 2017-02-27 MED ORDER — BUPROPION HCL 100 MG PO TABS
100.0000 mg | ORAL_TABLET | Freq: Two times a day (BID) | ORAL | Status: DC
  Filled 2017-02-27 (×6): qty 1

## 2017-02-27 NOTE — Progress Notes (Signed)
Recreation Therapy Notes  Date: 02/27/17 Time: 0930 Location: 400 Hall Dayroom  Group Topic: Stress Management  Goal Area(s) Addresses:  Patient will verbalize importance of using healthy stress management.  Patient will identify positive emotions associated with healthy stress management.   Behavioral Response: Engaged  Intervention: Stress Management  Activity :  Guided Imagery.  LRT introduced the stress management technique of guided imagery.  LRT read a script to allow patients to participate in the technique of guided imagery.  Patients were to follow along as the script was read to engage in the activity.  Education:  Stress Management, Discharge Planning.   Education Outcome: Acknowledges edcuation/In group clarification offered/Needs additional education  Clinical Observations/Feedback: Pt attended group.   Brilee Port, LRT/CTRS         Leo Fray A 02/27/2017 11:49 AM 

## 2017-02-27 NOTE — [facility] Group Notes (Signed)
[facility] LCSW Aftercare Discharge Planning Group Note   02/27/2017  8:45 AM  Participation Quality: Pt invited. Did not attend.  Koryn Charlot B Seniah Lawrence, MSW, LCSWA 02/27/2017 1:12 PM   

## 2017-02-27 NOTE — Progress Notes (Signed)
Patient ID: Amber York, female   DOB: 09/04/1996, 21 y.o.   MRN: [id number] D: client visible on the unit, seen in dayroom watching TV. Client reports goal for admit: "feel overall better" notes "feeling depressed, sad for greater than a week or so d/t combination of circumstances" admission helpful "see I'm not the only one that goes through this, being around people that understand, the nurses, doctors everybody" "talk to my mom, made her aware" Client plans to return to school counselor. A: Writer provided emotional support listening and encouraging client to follow through with support group and counselor. Medications reviewed, offered for sleep. Staff will monitor q76min for safety. R: Client is safe on the unit, attended group. Refused medication, but did taken Acetaminophen for menstrual cramps.(see MAR).

## 2017-02-27 NOTE — [facility] Group Notes (Signed)
[facility] LCSW Group Therapy  02/27/2017 1:15pm  Type of Therapy: Group Therapy   Topic: Overcoming Obstacles  Participation Level: Active  Participation Quality: Appropriate   Affect: Appropriate  Cognitive: Appropriate and Oriented  Insight: Developing/Improving and Improving  Engagement in Therapy: Improving  Modes of Intervention: Discussion, Exploration, Problem-solving and Support  Description of Group:  In this group patients will be encouraged to explore what they see as obstacles to their own wellness and recovery. They will be guided to discuss their thoughts, feelings, and behaviors related to these obstacles. The group will process together ways to cope with barriers, with attention given to specific choices patients can make. Each patient will be challenged to identify changes they are motivated to make in order to overcome their obstacles. This group will be process-oriented, with patients participating in exploration of their own experiences as well as giving and receiving support and challenge from other group members.  Summary of Patient Progress:  Pt initially did not wish to share with the group, however, she began to open up as group went on. Pt states that she often isolates because she doesn't want to bring her family or friends down with her mood. However, this is an issue for her because the more time she spends alone the worse her depression gets.  Therapeutic Modalities:  Cognitive Behavioral Therapy Solution Focused Therapy Motivational Interviewing Relapse Prevention Therapy  Jonathon Jordan, MSW, Theresia Majors [phone]

## 2017-02-27 NOTE — Progress Notes (Signed)
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 Referred by her Counsellor. She reports feeling depressed and suicidal. Reports crying spells, low energy levels, lack of motivation, loss of joy in past interest. She sent a text message to her friend expressing that she was going to die soon.  Seen today. Says she decided not to take medications as she would not be able to take it upon discharge. Says she has a month before going back home to [place]. Says she is not as depressed as she was when she came in. Says she is no longer having thoughts of suicide. Says she felt better after talking with her mom. She feels reassured that her mom is supportive. Patient states that she has been able to read while here. Says she is able to focus. Feels she would be able to cope well out in the community.  No evidence of psychosis. No anxiety. Says she has been sleeping well at night. Reports normal energy. Patient gave me her mom's number and requested we speak with her.  I called her mom Amber York on [phone]. Says she has spoken to patient almost on a daily basis. Feels she sounded better yesterday. She had concerns about effects of medications. Her concerns were addressed. Risk of not treating her depression was also discussed. Amber York states that she would talk to the patient and they would get back to [place].   Nursing staff reports that she has not been participating fully at groups. Her affect is blunted most of the time. She has not been observed to be internally stimulated. She has not voiced any futility thoughts.   Principal Problem: MDD (major depressive disorder) Diagnosis:   Patient Active Problem List   Diagnosis Date Noted  . 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 MDD (major depressive disorder) [F32.9] 02/24/2017   Total Time spent with patient: 20  minutes  Past Psychiatric History: As in H&P  Past Medical History:  Past Medical History:  Diagnosis Date  . Anxiety   . Depression    History reviewed. No pertinent surgical history. Family History: History reviewed. No pertinent family history. Family Psychiatric  History: As in H&P Social History:  History  Alcohol Use No     History  Drug Use No    Social History   Social History  . Marital status: Single    Spouse name: N/A  . Number of children: N/A  . Years of education: N/A   Social History Main Topics  . Smoking status: Never Smoker  . Smokeless tobacco: Never Used  . Alcohol use No  . Drug use: No  . Sexual activity: No   Other Topics Concern  . None   Social History Narrative  . None   Additional Social History:    Pain Medications: n/a History of alcohol / drug use?: No history of alcohol / drug abuse Negative Consequences of Use: Legal, Personal relationships Withdrawal Symptoms: Agitation        Sleep: Good  Appetite:  Good  Current Medications: Current Facility-Administered Medications  Medication Dose Route Frequency Provider Last Rate Last Dose  . acetaminophen (TYLENOL) tablet 650 mg  650 mg Oral Q6H PRN Adonis Brook, NP   650 mg at 02/26/17 2002  . alum & mag hydroxide-simeth (MAALOX/MYLANTA) 200-200-20 MG/5ML suspension 30 mL  30 mL Oral Q4H PRN Adonis Brook, NP      .  buPROPion [facility]) tablet 150 mg  150 mg Oral BID Georgiann Cocker, MD      . magnesium hydroxide (MILK OF MAGNESIA) suspension 30 mL  30 mL Oral Daily PRN Adonis Brook, NP      . traZODone (DESYREL) tablet 50 mg  50 mg Oral QHS PRN Adonis Brook, NP        Lab Results:  Results for orders placed or performed during the hospital encounter of 02/24/17 (from the past 48 hour(s))  TSH     Status: None   Collection Time: 02/26/17  6:21 AM  Result Value Ref Range   TSH 1.176 0.350 - 4.500 uIU/mL    Comment: Performed by a 3rd Generation assay with a  functional sensitivity of <=0.01 uIU/mL. Performed at [facility], [address] [place], [place] [address]   Hemoglobin A1c     Status: None   Collection Time: 02/26/17  6:21 AM  Result Value Ref Range   Hgb A1c MFr Bld 5.1 4.8 - 5.6 %    Comment: (NOTE)         Pre-diabetes: 5.7 - 6.4         Diabetes: >6.4         Glycemic control for adults with diabetes: <7.0    Mean Plasma Glucose 100 mg/dL    Comment: (NOTE) Performed At: [facility] [address] [place], [place] [id number] Mila Homer MD [id number] Performed at [facility], [address], [place], [place] [address]   Lipid panel     Status: None   Collection Time: 02/26/17  6:21 AM  Result Value Ref Range   Cholesterol 156 0 - 200 mg/dL   Triglycerides 53 <784 mg/dL   HDL 55 >69 mg/dL   Total CHOL/HDL Ratio 2.8 RATIO   VLDL 11 0 - 40 mg/dL   LDL Cholesterol 90 0 - 99 mg/dL    Comment:        Total Cholesterol/HDL:CHD Risk Coronary Heart Disease Risk Table                     Men   Women  1/2 Average Risk   3.4   3.3  Average Risk       5.0   4.4  2 X Average Risk   9.6   7.1  3 X Average Risk  23.4   11.0        Use the calculated Patient Ratio above and the CHD Risk Table to determine the patient's CHD Risk.        ATP III CLASSIFICATION (LDL):  <100     mg/dL   Optimal  629-528  mg/dL   Near or Above                    Optimal  130-159  mg/dL   Borderline  413-244  mg/dL   High  >010     mg/dL   Very High Performed at [facility], [address], [place], [place] [address]     Blood Alcohol level:  Lab Results  Component Value Date   [facility] <5 02/23/2017   ETH <5 09/04/2015    Metabolic Disorder Labs: Lab Results  Component Value Date   HGBA1C 5.1 02/26/2017   MPG 100 02/26/2017   No results found for: PROLACTIN Lab Results  Component Value Date   CHOL 156 02/26/2017   TRIG 53 02/26/2017   HDL  55 02/26/2017   CHOLHDL 2.8  02/26/2017   VLDL 11 02/26/2017   LDLCALC 90 02/26/2017    Physical Findings: AIMS: Facial and Oral Movements Muscles of Facial Expression: None, normal Lips and Perioral Area: None, normal Jaw: None, normal Tongue: None, normal,Extremity Movements Upper (arms, wrists, hands, fingers): None, normal Lower (legs, knees, ankles, toes): None, normal, Trunk Movements Neck, shoulders, hips: None, normal, Overall Severity Severity of abnormal movements (highest score from questions above): None, normal Incapacitation due to abnormal movements: None, normal Patient's awareness of abnormal movements (rate only patient's report): No Awareness, Dental Status Current problems with teeth and/or dentures?: No Does patient usually wear dentures?: No  CIWA:  CIWA-Ar Total: 0 COWS:  COWS Total Score: 0  Musculoskeletal: Strength & Muscle Tone: within normal limits Gait & Station: normal Patient leans: N/A  Psychiatric Specialty Exam: Physical Exam  Constitutional: She is oriented to person, place, and time. She appears well-developed and well-nourished.  HENT:  Head: Normocephalic and atraumatic.  Eyes: Conjunctivae are normal. Pupils are equal, round, and reactive to light.  Neck: Normal range of motion. Neck supple.  Cardiovascular: Normal rate, regular rhythm and normal heart sounds.   Respiratory: Effort normal and breath sounds normal.  GI: Soft. Bowel sounds are normal.  Musculoskeletal: Normal range of motion.  Neurological: She is alert and oriented to person, place, and time. She has normal reflexes.  Skin: Skin is warm and dry.  Psychiatric:  As above.    ROS  Blood pressure 114/73, pulse 63, temperature 98.1 F (36.7 C), temperature source Oral, resp. rate 16, height  (1.676 m), weight 69.4 kg (153 lb), last menstrual period 01/29/2017, SpO2 99 %.Body mass index is 24.69 kg/m.  General Appearance: In hospital clothing, was at the day room while peers were out on the  court yard. Psychomotor retardation.   Eye Contact:  Minimal  Speech:  Slow  Volume:  Decreased  Mood:  Depressed  Affect:  Blunted   Thought Process:  Linear  Orientation:  Full (Time, Place, and Person)  Thought Content:  No delusional theme. No preoccupation with violent thoughts. No negative ruminations. No obsession.  No hallucination in any modality.   Suicidal Thoughts:  No  Homicidal Thoughts:  No  Memory:  Immediate;   Fair Recent;   Fair Remote;   Fair  Judgement:  Fair  Insight:  Limited   Psychomotor Activity:  Psychomotor Retardation  Concentration:  Concentration: Fair and Attention Span: Fair  Recall:  Good  Fund of Knowledge:  Fair  Language:  Good  Akathisia:  No  Handed:    AIMS (if indicated):     Assets:  Financial Resources/Insurance Housing Physical Health Resilience Talents/Skills  ADL's:  Fair  Cognition:  WNL  Sleep:  Number of Hours: 6.75     Treatment Plan Summary:  Patient is still depressed. She is no longer expressing suicidal thoughts. I am worried that once she is back to unprotected environment, routine stressors would make her decompensate. I discussed my worries with patient and her mom. Her mom plans to talk to her and get back to [place].    Psychiatric: MDD  Medical:  Psychosocial:  Limited support  PLAN: 1. Encourage patient to accept recommended treatment 2. Encourage unit groups and activities 3. Continue to monitor mood, behavior and interaction with peers    Georgiann Cocker, MD 02/27/2017, 11:49 AM

## 2017-02-27 NOTE — Progress Notes (Signed)
Adult Psychoeducational Group Note  Date:  02/27/2017 Time:  10:46 PM  Group Topic/Focus:  Wrap-Up Group:   The focus of this group is to help patients review their daily goal of treatment and discuss progress on daily workbooks.  Participation Level:  Minimal  Participation Quality:  Appropriate  Affect:  Appropriate  Cognitive:  Appropriate  Insight: Appropriate  Engagement in Group:  Engaged  Modes of Intervention:  Socialization and Support  Additional Comments:  Patient attended and participated in group tonight. She reports the highlight of her day was getting to talk to her friend.  Lita Mains [facility] 02/27/2017, 10:46 PM

## 2017-02-27 NOTE — Progress Notes (Signed)
Patient ID: Amber York, female   DOB: [birth year], 21 y.o.   MRN: [id number]  DAR: Pt. Denies SI/HI and A/V Hallucinations. She is flat in affect and depressed in mood at this time. She reports sleep is fair, appetite is good, energy level is normal, and concentration is good. She rates depression, hopelessness, and anxiety 2/10. Patient does report abdominal cramping but refuses intervention. Support and encouragement provided to the patient. Patient denied scheduled medication this morning. She also reports that she wants to discharge. Patient is minimal and resistant to care at this time. She is isolated to her bed throughout the morning and is seen in the dayroom after lunch however appears to be keeping to herself. Q15 minute checks are maintained for safety.

## 2017-02-28 MED ORDER — SERTRALINE HCL 50 MG PO TABS
50.0000 mg | ORAL_TABLET | Freq: Every day | ORAL | Status: DC
  Administered 2017-02-28 – 2017-03-02 (×3): 50 mg via ORAL
  Filled 2017-02-28: qty 7
  Filled 2017-02-28 (×5): qty 1
  Filled 2017-02-28: qty 7
  Filled 2017-02-28: qty 1

## 2017-02-28 NOTE — Progress Notes (Signed)
D: Pt presents with flat affect. Pt denies depression and anxiety this morning. Pt denies SI. Pt refused to take Wellbutrin this morning. MD  Made aware of pt refusal. Pt stated that she doesn't want to take any meds that she's not going to commit to taking when she discharges home. Pt verbalized that she's seeking counseling services only. Pt listed her mother and friends as her support system.  A: Orders reviewed with pt. Medication offered. Verbal support provided. Pt encouraged to attend groups. 15 minute checks performed for safety.  R: Pt receptive to non pharmacological therapy.

## 2017-02-28 NOTE — Progress Notes (Signed)
[facility] MD Progress Note  02/28/2017 3:14 PM Amber York  MRN:  [id number] Subjective:   Patient states she is feeling somewhat better, less depressed, and her current focus is on being discharged soon. She does , however, endorse history of depression and recent significant neuro-vegetative symptoms of depression, although improving gradually. Today denies any active suicidal ideations. States she has been refusing prescribed antidepressant ( Buproprion) due to being unsure if she actually needs an antidepressant and fears of side effects. Objective : I have discussed case with treatment team and have met with patient. Patient reports partial improvement and initially minimizes depression. She denies any current or active suicidal ideations and is future oriented . However, during session she does describe episodes of depression, usually mild to moderate, and significant recent neuro-vegetative symptoms of depression, to include changes in appetite, sleep, energy level , some anhedonia, and she feels these symptoms have affected her social and academic functioning . Staff reports patient has continued to present with flat , vaguely anxious affect. Visible on unit, going to some groups, no disruptive or agitated behaviors . As above, she has not been taking prescribed antidepressant , Wellbutrin.   Principal Problem: MDD (major depressive disorder) Diagnosis:   Patient Active Problem List   Diagnosis Date Noted  . Major depressive disorder, single episode, severe without psychotic features ([facility]) [F32.2] 02/24/2017  . MDD (major depressive disorder) [F32.9] 02/24/2017   Total Time spent with patient: 20 minutes  Past Psychiatric History: As in H&P  Past Medical History:  Past Medical History:  Diagnosis Date  . Anxiety   . Depression    History reviewed. No pertinent surgical history. Family History: History reviewed. No pertinent family history. Family Psychiatric  History: As in  H&P Social History:  History  Alcohol Use No     History  Drug Use No    Social History   Social History  . Marital status: Single    Spouse name: N/A  . Number of children: N/A  . Years of education: N/A   Social History Main Topics  . Smoking status: Never Smoker  . Smokeless tobacco: Never Used  . Alcohol use No  . Drug use: No  . Sexual activity: No   Other Topics Concern  . None   Social History Narrative  . None   Additional Social History:    Pain Medications: n/a History of alcohol / drug use?: No history of alcohol / drug abuse Negative Consequences of Use: Legal, Personal relationships Withdrawal Symptoms: Agitation        Sleep: Fair  Appetite:  improving   Current Medications: Current Facility-Administered Medications  Medication Dose Route Frequency Provider Last Rate Last Dose  . acetaminophen (TYLENOL) tablet 650 mg  650 mg Oral Q6H PRN Kerrie Buffalo, NP   650 mg at 02/27/17 2131  . alum & mag hydroxide-simeth (MAALOX/MYLANTA) 200-200-20 MG/5ML suspension 30 mL  30 mL Oral Q4H PRN Kerrie Buffalo, NP      . magnesium hydroxide (MILK OF MAGNESIA) suspension 30 mL  30 mL Oral Daily PRN Kerrie Buffalo, NP      . traZODone (DESYREL) tablet 50 mg  50 mg Oral QHS PRN Kerrie Buffalo, NP        Lab Results:  No results found for this or any previous visit (from the past 48 hour(s)).  Blood Alcohol level:  Lab Results  Component Value Date   [facility] <5 02/23/2017   ETH <5 36/64/4034    Metabolic Disorder Labs:  Lab Results  Component Value Date   HGBA1C 5.1 02/26/2017   MPG 100 02/26/2017   No results found for: PROLACTIN Lab Results  Component Value Date   CHOL 156 02/26/2017   TRIG 53 02/26/2017   HDL 55 02/26/2017   CHOLHDL 2.8 02/26/2017   VLDL 11 02/26/2017   LDLCALC 90 02/26/2017    Physical Findings: AIMS: Facial and Oral Movements Muscles of Facial Expression: None, normal Lips and Perioral Area: None, normal Jaw: None,  normal Tongue: None, normal,Extremity Movements Upper (arms, wrists, hands, fingers): None, normal Lower (legs, knees, ankles, toes): None, normal, Trunk Movements Neck, shoulders, hips: None, normal, Overall Severity Severity of abnormal movements (highest score from questions above): None, normal Incapacitation due to abnormal movements: None, normal Patient's awareness of abnormal movements (rate only patient's report): No Awareness, Dental Status Current problems with teeth and/or dentures?: No Does patient usually wear dentures?: No  CIWA:  CIWA-Ar Total: 0 COWS:  COWS Total Score: 0  Musculoskeletal: Strength & Muscle Tone: within normal limits Gait & Station: normal Patient leans: N/A  Psychiatric Specialty Exam: Physical Exam  Constitutional: She is oriented to person, place, and time. She appears well-developed and well-nourished.  HENT:  Head: Normocephalic and atraumatic.  Eyes: Conjunctivae are normal. Pupils are equal, round, and reactive to light.  Neck: Normal range of motion. Neck supple.  Cardiovascular: Normal rate, regular rhythm and normal heart sounds.   Respiratory: Effort normal and breath sounds normal.  GI: Soft. Bowel sounds are normal.  Musculoskeletal: Normal range of motion.  Neurological: She is alert and oriented to person, place, and time. She has normal reflexes.  Skin: Skin is warm and dry.  Psychiatric:  As above.    ROS denies chest pain, no shortness of breath, no vomiting  Blood pressure 116/66, pulse 93, temperature 97.8 F (36.6 C), temperature source Oral, resp. rate 20, height '5\' 6"'$  (1.676 m), weight 69.4 kg (153 lb), last menstrual period 01/29/2017, SpO2 99 %.Body mass index is 24.69 kg/m.  General Appearance: improved grooming    Eye Contact:  Good  Speech:  Normal Rate  Volume:  soft  Mood:  states she is feeling better, but still depressed   Affect:  Vaguely constricted, does smile briefly at times   Thought Process:  Goal  Directed and Descriptions of Associations: Intact  Orientation:  Full (Time, Place, and Person)  Thought Content:  No hallucinations, no delusions   Suicidal Thoughts:  No denies any suicidal or self injurious ideations, denies any homicidal or violent ideations Contracts for safety on unit   Homicidal Thoughts:  No  Memory:   Recent and remote grossly intact   Judgement:  Other:  fair- improving   Insight:  Fair- improving   Psychomotor Activity:  Normal  Concentration:  Concentration: Good and Attention Span: Good  Recall:  Good  Fund of Knowledge:  Good  Language:  Good  Akathisia:  No  Handed:    AIMS (if indicated):     Assets:  Armed forces logistics/support/administrative officer Physical Health Resilience Vocational/Educational  ADL's:  Improved   Cognition:  WNL  Sleep:  Number of Hours: 5.5   Assessment - patient is a 21 year old single female, originally from [place]., currently in college here in [place]. She does endorse history of prior ,usually mild to moderate , episodes of depression, and describes recent more significant depression and neuro-vegetative symptoms of MDD . We discussed , and reviewed indication and risk/benefits of an antidepressant trial. She  agreed to an antidepressant trial, but would rather try an SSRI, to also help address anxiety. Side effects, including potential risk of increased SI in young adults in the early stages of treatment with antidepressant medication.   Treatment Plan Summary:  Treatment plan reviewed as below today 4/17.  Encourage group and milieu participation to work on Radiographer, therapeutic and symptom reduction Continue to work on disposition planning options  D/C Wellbutrin - see above  Start Zoloft 50 mgrs QDAY initially for depression, anxiety Continue Trazodone 50 mgrs QHS PRN for insomnia as needed   Jenne Campus, MD 02/28/2017, 3:14 PM   Patient ID: Chales Abrahams, female   DOB: 12/31/1995, 21 y.o.   MRN: [id number]

## 2017-02-28 NOTE — Progress Notes (Signed)
Recreation Therapy Notes   Animal-Assisted Activity (AAA) Program Checklist/Progress Notes Patient Eligibility Criteria Checklist & Daily Group note for Rec TxIntervention  Date: 04.17.2018 Time: 2:45pm Location: [address]   AAA/T Program Assumption of Risk Form signed by Patient/ or Parent Legal Guardian Yes  Patient is free of allergies or sever asthma Yes  Patient reports no fear of animals Yes  Patient reports no history of cruelty to animals Yes  Patient understands his/her participation is voluntary Yes  Patient washes hands before animal contact Yes  Patient washes hands after animal contact Yes  Behavioral Response: Engaged, Appropriate   Education:Hand Washing, Appropriate Animal Interaction   Education Outcome: Acknowledges education.   Clinical Observations/Feedback: Patient attended session and interacted appropriately with therapy dog and peers.   Marykay Lex Gracemarie Skeet, LRT/CTRS        Hagen Bohorquez L 02/28/2017 3:09 PM

## 2017-02-28 NOTE — Plan of Care (Signed)
Problem: Coping: Goal: Ability to verbalize feelings will improve Outcome: Progressing Ability to verbalize feelings will improve AEB reporting "feel better, knowing that I'm not the only one that goes thru this, being around people that understand"

## 2017-02-28 NOTE — [facility] Group Notes (Signed)
[facility] Group Therapy 02/28/2017 1:15pm  Type of Therapy: [facility] [employer] Presentation  Participation Level: Active  Participation Quality: Attentive  Affect: Appropriate  Cognitive: Oriented  Insight: Developing/Improving  Engagement in Therapy: Engaged  Modes of Intervention: Discussion, Education and Socialization  Summary of Progress/Problems: Mental [employer] Association (MHA) Speaker came to talk about his personal journey with substance abuse and addiction. The pt processed ways by which to relate to the speaker. MHA speaker provided handouts and educational information pertaining to groups and services offered by the MHA. Pt was engaged in speaker's presentation and was receptive to resources provided.    Yehoshua Vitelli B. Chaley Castellanos, MSW, LCSWA 02/28/2017 4:05 PM   

## 2017-02-28 NOTE — Tx Team (Signed)
Interdisciplinary Treatment and Diagnostic Plan Update 02/28/2017 Time of Session: 9:30am  Amber York  MRN: [id number]  Principal Diagnosis: MDD (major depressive disorder)  Secondary Diagnoses: Principal Problem:   MDD (major depressive disorder)   Current Medications:  Current Facility-Administered Medications  Medication Dose Route Frequency Provider Last Rate Last Dose  . acetaminophen (TYLENOL) tablet 650 mg  650 mg Oral Q6H PRN Adonis Brook, NP   650 mg at 02/27/17 2131  . alum & mag hydroxide-simeth (MAALOX/MYLANTA) 200-200-20 MG/5ML suspension 30 mL  30 mL Oral Q4H PRN Adonis Brook, NP      . magnesium hydroxide (MILK OF MAGNESIA) suspension 30 mL  30 mL Oral Daily PRN Adonis Brook, NP      . traZODone (DESYREL) tablet 50 mg  50 mg Oral QHS PRN Adonis Brook, NP        PTA Medications: Prescriptions Prior to Admission  Medication Sig Dispense Refill Last Dose  . cetirizine (ZYRTEC) 10 MG tablet Take 10 mg by mouth daily.   Unknown at Unknown time  . hydrocortisone cream 0.5 % Apply 1 application topically 2 (two) times daily.   Unknown at Unknown time  . triamcinolone (NASACORT) 55 MCG/ACT AERO nasal inhaler Place 2 sprays into the nose daily.   Unknown at Unknown time    Treatment Modalities: Medication Management, Group therapy, Case management,  1 to 1 session with clinician, Psychoeducation, Recreational therapy.  Patient Stressors: [employer] fast streamer difficulties Health problems Patient Strengths: Ability for insight Active sense of humor  Physician Treatment Plan for Primary Diagnosis: MDD (major depressive disorder) Long Term Goal(s): Improvement in symptoms so as ready for discharge Short Term Goals: Ability to identify changes in lifestyle to reduce recurrence of condition will improve Ability to verbalize feelings will improve Ability to disclose and discuss suicidal ideas Ability to demonstrate self-control will improve Ability to  identify and develop effective coping behaviors will improve Ability to maintain clinical measurements within normal limits will improve Compliance with prescribed medications will improve  Medication Management: Evaluate patient's response, side effects, and tolerance of medication regimen.  Therapeutic Interventions: 1 to 1 sessions, Unit Group sessions and Medication administration.  Evaluation of Outcomes: Progressing  Physician Treatment Plan for Secondary Diagnosis: Principal Problem:   MDD (major depressive disorder)  Long Term Goal(s): Improvement in symptoms so as ready for discharge  Short Term Goals: Ability to identify changes in lifestyle to reduce recurrence of condition will improve Ability to verbalize feelings will improve Ability to disclose and discuss suicidal ideas Ability to demonstrate self-control will improve Ability to identify and develop effective coping behaviors will improve Ability to maintain clinical measurements within normal limits will improve Compliance with prescribed medications will improve  Medication Management: Evaluate patient's response, side effects, and tolerance of medication regimen.  Therapeutic Interventions: 1 to 1 sessions, Unit Group sessions and Medication administration.  Evaluation of Outcomes: Progressing  RN Treatment Plan for Primary Diagnosis: MDD (major depressive disorder) Long Term Goal(s): Knowledge of disease and therapeutic regimen to maintain health will improve  Short Term Goals: Ability to remain free from injury will improve and Compliance with prescribed medications will improve  Medication Management: RN will administer medications as ordered by provider, will assess and evaluate patient's response and provide education to patient for prescribed medication. RN will report any adverse and/or side effects to prescribing provider.  Therapeutic Interventions: 1 on 1 counseling sessions, Psychoeducation,  Medication administration, Evaluate responses to treatment, Monitor vital signs and CBGs as ordered, Perform/monitor CIWA,  COWS, AIMS and Fall Risk screenings as ordered, Perform wound care treatments as ordered.  Evaluation of Outcomes: Progressing  LCSW Treatment Plan for Primary Diagnosis: MDD (major depressive disorder) Long Term Goal(s): Safe transition to appropriate next level of care at discharge, Engage patient in therapeutic group addressing interpersonal concerns. Short Term Goals: Engage patient in aftercare planning with referrals and resources, Increase ability to appropriately verbalize feelings, Increase emotional regulation and Increase skills for wellness and recovery  Therapeutic Interventions: Assess for all discharge needs, 1 to 1 time with Social worker, Explore available resources and support systems, Assess for adequacy in community support network, Educate family and significant other(s) on suicide prevention, Complete Psychosocial Assessment, Interpersonal group therapy.  Evaluation of Outcomes: Progressing  Progress in Treatment: Attending groups: Yes  Participating in groups: Yes Taking medication as prescribed: Yes, MD continues to assess for medication changes as needed Toleration medication: Yes, no side effects reported at this time Family/Significant other contact made: No, attempting contact with pt's mother. Patient understands diagnosis: Developing insight Discussing patient identified problems/goals with staff: Yes Medical problems stabilized or resolved: Yes Denies suicidal/homicidal ideation: Yes Issues/concerns per patient self-inventory: None Other: N/A  New problem(s) identified: None identified at this time.   New Short Term/Long Term Goal(s): None identified at this time.   Discharge Plan or Barriers: Pt will return home and follow up with an outpatient provider.  Reason for Continuation of Hospitalization:  Anxiety  Depression Medication  stabilization Suicidal ideation  Estimated Length of Stay: 3-5 days  Attendees: Patient: 02/28/2017 3:00 PM  Physician: Dr. Jama Flavors 02/28/2017 3:00 PM  Nursing: Rodell Perna, RN 02/28/2017 3:00 PM  RN Care Manager: Onnie Boer, RN 02/28/2017 3:00 PM  Social Worker: Donnelly Stager, LCSWA 02/28/2017 3:00 PM  Recreational Therapist:  02/28/2017 3:00 PM  Other: Armandina Stammer, NP; Gray Bernhardt, NP 02/28/2017 3:00 PM  Other:  02/28/2017 3:00 PM  Other: 02/28/2017 3:00 PM   Scribe for Treatment Team: Jonathon Jordan, MSW,LCSWA 02/28/2017 3:00 PM

## 2017-02-28 NOTE — [facility] Suicide Risk Assessment (Signed)
[facility] INPATIENT:  Family/Significant Other Suicide Prevention Education  Suicide Prevention Education:  Contact Attempts: Amber York (mom [phone]), (name of family member/significant other) has been identified by the patient as the family member/significant other with whom the patient will be residing, and identified as the person(s) who will aid the patient in the event of a mental health crisis.  With written consent from the patient, two attempts were made to provide suicide prevention education, prior to and/or following the patient's discharge.  We were unsuccessful in providing suicide prevention education.  A suicide education pamphlet was given to the patient to share with family/significant other.  Date and time of first attempt: 02/28/17 @ 3:03pm. No answer, CSW left a message.   Amber York 02/28/2017, 3:03 PM

## 2017-03-01 NOTE — Progress Notes (Signed)
Recreation Therapy Notes  Date: 03/01/17 Time: 0930 Location: [address] Dayroom  Group Topic: Stress Management  Goal Area(s) Addresses:  Patient will verbalize importance of using healthy stress management.  Patient will identify positive emotions associated with healthy stress management.   Intervention: Stress Management  Activity :  Meditation.  LRT introduced the stress management technique of meditation.  LRT played a meditation on choice from the Calm app to guide patients through the meditation.  Patients were to follow along as the meditation was played to engage in the technique.  Education:  Stress Management, Discharge Planning.   Education Outcome: Acknowledges edcuation/In group clarification offered/Needs additional education  Clinical Observations/Feedback: Pt did not attend group.   Caroll Rancher, LRT/CTRS         Caroll Rancher A 03/01/2017 12:47 PM

## 2017-03-01 NOTE — Progress Notes (Signed)
D: Pt asked the writer "what happens with discharge". Stated that she's scheduled to be discharged tomorrow. Writer explained that the SW and Dr would go over medications as well as FU visits with her prior to discharge. Pt has no other questions or concerns.    A:  Support and encouragement was offered. 15 min checks continued for safety.  R: Pt remains safe.

## 2017-03-01 NOTE — Progress Notes (Signed)
D: Pt presents with flat affect and anxious mood.  Pt rates depression 2/10. Anxiety 3/10. Pt denies suicidal thoughts. Pt reports good sleep and appetite. Pt verbalized to Clinical research associate that she felt anxious last night and felt a boost in energy. Pt goal today "talking to and educating my support system". Pt verbalized to Clinical research associate that she's tolerating Zoloft well. No side effects to meds verbalized by pt. A: Medications reviewed with pt. Medications administered as ordered per MD. Verbal support provided. Pt encouraged to attend groups. 15 minute checks performed for safety.  R: Pt receptive to tx.

## 2017-03-01 NOTE — Progress Notes (Signed)
D: When asked about her day pt stated, "pretty good". Stated informed that she's due to be discharged tomorrow "around 1500".  Pt plans to "touch bases with the counselor at school". Pt has no other questions or concerns.    A:  Support and encouragement was offered. 15 min checks continued for safety.  R: Pt remains safe.

## 2017-03-01 NOTE — [facility] Suicide Risk Assessment (Signed)
[facility] INPATIENT:  Family/Significant Other Suicide Prevention Education  Suicide Prevention Education:  Education Completed; Amber York, Pt's mother [phone], has been identified by the patient as the family member/significant other with whom the patient will be residing, and identified as the person(s) who will aid the patient in the event of a mental health crisis (suicidal ideations/suicide attempt).  With written consent from the patient, the family member/significant other has been provided the following suicide prevention education, prior to the and/or following the discharge of the patient.  The suicide prevention education provided includes the following:  Suicide risk factors  Suicide prevention and interventions  National Suicide Hotline telephone number  [facility] assessment telephone number  [facility] Emergency Assistance 911  [facility] and/or Residential Mobile Crisis Unit telephone number  Request made of family/significant other to:  Remove weapons (e.g., guns, rifles, knives), all items previously/currently identified as safety concern.    Remove drugs/medications (over-the-counter, prescriptions, illicit drugs), all items previously/currently identified as a safety concern.  The family member/significant other verbalizes understanding of the suicide prevention education information provided.  The family member/significant other agrees to remove the items of safety concern listed above.  Amber York 03/01/2017, 11:13 AM

## 2017-03-01 NOTE — [facility] Group Notes (Signed)
[facility] LCSW Group Therapy 03/01/2017 1:15pm  Type of Therapy and Topic: Group Therapy: Avoiding Self-Sabotaging and Enabling Behaviors   Participation Level: Minimal  Description of Group:  Learn how to identify obstacles, self-sabotaging and enabling behaviors, what are they, why do we do them and what needs do these behaviors meet? Discuss unhealthy relationships and how to have positive healthy boundaries with those that sabotage and enable. Explore aspects of self-sabotage and enabling in yourself and how to limit these self-destructive behaviors in everyday life. A scaling question is used to help patient look at where they are now in their motivation to change, from 1 to 10 (lowest to highest motivation).   Therapeutic Goals:  1. Patient will identify one obstacle that relates to self-sabotage and enabling behaviors 2. Patient will identify one personal self-sabotaging or enabling behavior they did prior to admission 3. Patient able to establish a plan to change the above identified behavior they did prior to admission:  4. Patient will demonstrate ability to communicate their needs through discussion and/or role plays.  Summary of Patient Progress:  Pt was attentive to group discussion but did not engage verbally.  Therapeutic Modalities:  Cognitive Behavioral Therapy  Person-Centered Therapy  Motivational Interviewing   Vernie Shanks, LCSW 03/01/2017 5:12 PM

## 2017-03-01 NOTE — Progress Notes (Signed)
Pt attend wrap up group. Her day was a 9. Her goal to find out information about her discharge date.

## 2017-03-01 NOTE — Progress Notes (Signed)
At apprx 2215 pt approached the writer and explained that she was anxious. Stated that she could feel her chest pounding. Pt explained that she felt it was the zoloft that she'd been given earlier today. Writer explained that it was more than likely not the zoloft since she'd gotten it apprx 5 hrs ago.   ASked the mht to print out picture pages and word searches for the pt in hopes of distracting.

## 2017-03-01 NOTE — Progress Notes (Signed)
Pt rated her day a 8. Pt goal is to work on discharge planning.

## 2017-03-01 NOTE — Progress Notes (Signed)
[facility] MD Progress Note  03/01/2017 5:12 PM Amber York  MRN:  [id number] Subjective:   She reports some ongoing anxiety, depression, but states that in general she feels much better, and at this time is focused on being discharged soon. She states that after she took Zoloft yesterday she felt " like more anxious,like a short burst of energy", which lasted briefly, but did not have any symptoms after today's dose. She remains anxious about antidepressants, but states she is feeling more comfortable with taking the Zoloft at this time . Patient does endorse a history of depression, and today stresses history suggestive of social anxiety, with significant anxiety in social situations at college, resulting in avoidance symptoms.  Denies any suicidal ideations .  Objective : I have discussed case with treatment team and have met with patient. Patient presents with partial improvement at this time, and states she is feeling better than on admission. As above, describes history of depression, which she currently characterizes as mild, but also endorses history of anxiety, particularly social anxiety symptoms, which she feels have contributed to her depression. She denies suicidal ideations, minimizes neuro-vegetative symptoms of depression at this time, and is focused on being discharged soon. We reviewed Zoloft side effects, to include risk of GI symptoms, as well as potential for increased suicidal ideations in early stages of antidepressant treatment in young adults . Patient is visible on unit, going to some groups. No disruptive of agitated behaviors .   Principal Problem: MDD (major depressive disorder) Diagnosis:   Patient Active Problem List   Diagnosis Date Noted  . Major depressive disorder, single episode, severe without psychotic features ([place]) [F32.2] 02/24/2017  . MDD (major depressive disorder) [F32.9] 02/24/2017   Total Time spent with patient: 20 minutes  Past Psychiatric History:  As in H&P  Past Medical History:  Past Medical History:  Diagnosis Date  . Anxiety   . Depression    History reviewed. No pertinent surgical history. Family History: History reviewed. No pertinent family history. Family Psychiatric  History: As in H&P Social History:  History  Alcohol Use No     History  Drug Use No    Social History   Social History  . Marital status: Single    Spouse name: N/A  . Number of children: N/A  . Years of education: N/A   Social History Main Topics  . Smoking status: Never Smoker  . Smokeless tobacco: Never Used  . Alcohol use No  . Drug use: No  . Sexual activity: No   Other Topics Concern  . None   Social History Narrative  . None   Additional Social History:    Pain Medications: n/a History of alcohol / drug use?: No history of alcohol / drug abuse Negative Consequences of Use: Legal, Personal relationships Withdrawal Symptoms: Agitation        Sleep: improved   Appetite:  Good  Current Medications: Current Facility-Administered Medications  Medication Dose Route Frequency Provider Last Rate Last Dose  . acetaminophen (TYLENOL) tablet 650 mg  650 mg Oral Q6H PRN Kerrie Buffalo, NP   650 mg at 02/27/17 2131  . alum & mag hydroxide-simeth (MAALOX/MYLANTA) 200-200-20 MG/5ML suspension 30 mL  30 mL Oral Q4H PRN Kerrie Buffalo, NP      . magnesium hydroxide (MILK OF MAGNESIA) suspension 30 mL  30 mL Oral Daily PRN Kerrie Buffalo, NP      . sertraline (ZOLOFT) tablet 50 mg  50 mg Oral Daily Fernando A Cobos,  MD   50 mg at 03/01/17 1100  . traZODone (DESYREL) tablet 50 mg  50 mg Oral QHS PRN Kerrie Buffalo, NP   50 mg at 02/28/17 2308    Lab Results:  No results found for this or any previous visit (from the past 48 hour(s)).  Blood Alcohol level:  Lab Results  Component Value Date   ETH <5 02/23/2017   ETH <5 26/71/2458    Metabolic Disorder Labs: Lab Results  Component Value Date   HGBA1C 5.1 02/26/2017   MPG 100  02/26/2017   No results found for: PROLACTIN Lab Results  Component Value Date   CHOL 156 02/26/2017   TRIG 53 02/26/2017   HDL 55 02/26/2017   CHOLHDL 2.8 02/26/2017   VLDL 11 02/26/2017   LDLCALC 90 02/26/2017    Physical Findings: AIMS: Facial and Oral Movements Muscles of Facial Expression: None, normal Lips and Perioral Area: None, normal Jaw: None, normal Tongue: None, normal,Extremity Movements Upper (arms, wrists, hands, fingers): None, normal Lower (legs, knees, ankles, toes): None, normal, Trunk Movements Neck, shoulders, hips: None, normal, Overall Severity Severity of abnormal movements (highest score from questions above): None, normal Incapacitation due to abnormal movements: None, normal Patient's awareness of abnormal movements (rate only patient's report): No Awareness, Dental Status Current problems with teeth and/or dentures?: No Does patient usually wear dentures?: No  CIWA:  CIWA-Ar Total: 0 COWS:  COWS Total Score: 0  Musculoskeletal: Strength & Muscle Tone: within normal limits Gait & Station: normal Patient leans: N/A  Psychiatric Specialty Exam: Physical Exam  Constitutional: She is oriented to person, place, and time. She appears well-developed and well-nourished.  HENT:  Head: Normocephalic and atraumatic.  Eyes: Conjunctivae are normal. Pupils are equal, round, and reactive to light.  Neck: Normal range of motion. Neck supple.  Cardiovascular: Normal rate, regular rhythm and normal heart sounds.   Respiratory: Effort normal and breath sounds normal.  GI: Soft. Bowel sounds are normal.  Musculoskeletal: Normal range of motion.  Neurological: She is alert and oriented to person, place, and time. She has normal reflexes.  Skin: Skin is warm and dry.  Psychiatric:  As above.    ROS  Denies chest pain, denies shortness of breath, no vomiting   Blood pressure (!) 121/59, pulse 66, temperature 98.2 F (36.8 C), temperature source Oral, resp.  rate 18, height '5\' 6"'$  (1.676 m), weight 69.4 kg (153 lb), last menstrual period 01/29/2017, SpO2 99 %.Body mass index is 24.69 kg/m.  General Appearance: improved grooming   Eye Contact:  Good  Speech:  Normal Rate  Volume:  Normal  Mood:  reports improving mood   Affect: more reactive, smiles at times appropriately, vaguely anxious  Thought Process:  Linear and Descriptions of Associations: Intact  Orientation:  Other:  fully alert and attentive  Thought Content:  Denies hallucinations, no delusions, does not appear internally preoccupied   Suicidal Thoughts:  No no suicidal or self injurious ideations, no homicidal or violent ideations   Homicidal Thoughts:  No  Memory:   Recent and remote grossly intact   Judgement:  Other:  improving   Insight:  Improving   Psychomotor Activity:  Normal  Concentration:  Concentration: Good and Attention Span: Good  Recall:  Good  Fund of Knowledge:  Good  Language:  Good  Akathisia:  Negative  Handed:    AIMS (if indicated):     Assets:  Communication Skills Physical Health Resilience Vocational/Educational  ADL's:  Good  Cognition:  WNL  Sleep:  Number of Hours: 6.75   Assessment - patient reports improving mood and currently denies /minimizes neuro-vegetative symptoms of depression, and denies any current suicidal ideations. She does endorse history of significant social anxiety, which she feels worsened , exacerbated after she started college and which has caused difficulties in her social functioning . She had been ambivalent about taking an antidepressant but today seems more comfortable with continuing Zoloft trial- we discussed side effects.   Treatment Plan Summary:  Treatment plan reviewed as below today 4/18.  Encourage group and milieu participation to work on Radiographer, therapeutic and symptom reduction Continue to work on disposition planning options  Continue  Zoloft 50 mgrs QDAY initially for depression, anxiety Continue Trazodone  50 mgrs QHS PRN for insomnia as needed   Jenne Campus, MD 03/01/2017, 5:12 PM   Patient ID: Amber York, female   DOB: November 07, 1996, 21 y.o.   MRN: [id number]

## 2017-03-02 MED ORDER — TRAZODONE HCL 50 MG PO TABS: 50.0000 mg | ORAL_TABLET | Freq: Every evening | ORAL | 0 refills | Status: AC | PRN

## 2017-03-02 MED ORDER — SERTRALINE HCL 50 MG PO TABS: 50.0000 mg | ORAL_TABLET | Freq: Every day | ORAL | 0 refills | Status: AC

## 2017-03-02 NOTE — [facility] Suicide Risk Assessment (Signed)
[facility] Discharge Suicide Risk Assessment   Principal Problem: MDD (major depressive disorder) Discharge Diagnoses:  Patient Active Problem List   Diagnosis Date Noted  . 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 MDD (major depressive disorder) [F32.9] 02/24/2017    Total Time spent with patient: 30 minutes  Musculoskeletal: Strength & Muscle Tone: within normal limits Gait & Station: normal Patient leans: N/A  Psychiatric Specialty Exam: ROS denies headache, no chest pain, no shortness of breath, no vomiting   Blood pressure 116/70, pulse 76, temperature 98.3 F (36.8 C), temperature source Oral, resp. rate 16, height  (1.676 m), weight 69.4 kg (153 lb), last menstrual period 01/29/2017, SpO2 99 %.Body mass index is 24.69 kg/m.  General Appearance: improved grooming   Eye Contact::  Good  Speech:  Normal Rate409  Volume:  Normal  Mood:  improved, at this time minimizes depression, states she feels better   Affect:  appropriate, reactive   Thought Process:  Linear  Orientation:  Full (Time, Place, and Person)  Thought Content:  no hallucinations, no delusions, not internally preoccupied   Suicidal Thoughts:  No denies any suicidal or self injurious ideations, no homicidal or violent ideations  Homicidal Thoughts:  No  Memory:  recent and remote grossly intact   Judgement:  Other:  improving   Insight:  improving   Psychomotor Activity:  Normal  Concentration:  Good  Recall:  Good  Fund of Knowledge:Good  Language: Good  Akathisia:  Negative  Handed:  Right  AIMS (if indicated):     Assets:  Communication Skills Desire for Improvement Resilience  Sleep:  Number of Hours: 6.5  Cognition: WNL  ADL's:  Intact   Mental Status Per Nursing Assessment::   On Admission:     Demographic Factors:  21 year old single female, originally from [place], in college  Loss Factors: Stressors regarding being  away from home, academic stressors   Historical Factors: No prior psychiatric admissions, no history of prior suicide attempts, no history of self cutting   Risk Reduction Factors:   Sense of responsibility to family, Living with another person, especially a relative, Positive coping skills or problem solving skills and invested in college/edcuation  Continued Clinical Symptoms:  At this time patient reports improving mood, feels " like myself , back to normal", affect appropriate, vaguely anxious, no thought disorder, no suicidal or self injurious ideations, no homicidal or violent ideations, no psychotic symptoms, future oriented . Denies medication side effects. Tolerating medications well - side effects reviewed, to include risk of suicidal ideations early in treatment with antidepressants in young adults  Of note, in addition to depression, patient has also endorsed symptoms suggestive of social anxiety. Behavior on unit calm and in good control.  Cognitive Features That Contribute To Risk:  No gross cognitive deficits noted upon discharge. Is alert , attentive, and oriented x 3   Suicide Risk:  Mild:  Suicidal ideation of limited frequency, intensity, duration, and specificity.  There are no identifiable plans, no associated intent, mild dysphoria and related symptoms, good self-control (both objective and subjective assessment), few other risk factors, and identifiable protective factors, including available and accessible social support.  Follow-up Information    [facility] Follow up on 03/03/2017.   Why:  at 10:00am for therapy with Dr. Bary Richard. Contact information: Lucy [name], [address] [phone] [address] [place] [phone]        [facility] Follow up on 03/31/2017.   Why:  at 3:30pm with Dr. Jannifer Franklin for medication management.  Contact information: [address] [place], [place] [address] [phone] Fax: [phone]          Plan Of Care/Follow-up recommendations:  Activity:  as tolerated  Diet:  Regular Tests:  NA Other:  See below  Patient is requesting discharge and at this time there are no grounds for involuntary commitment  She is leaving unit in good spirits Plans to return to college housing  Follow up as above.  Craige Cotta, MD 03/02/2017, 10:04 AM

## 2017-03-02 NOTE — Progress Notes (Signed)
  [facility] Adult Case Management Discharge Plan :  Will you be returning to the same living situation after discharge:  Yes,  pt returning home. At discharge, do you have transportation home?: Yes,  pt has access to transportation. Do you have the ability to pay for your medications: Yes,  prescriptions and samples provided.  Release of information consent forms completed and in the chart;  Patient's signature needed at discharge.  Patient to Follow up at: Follow-up Information    [facility] Follow up on 03/03/2017.   Why:  at 10:00am for therapy with Dr. Bary Richard. Contact information: Lucy [name], [address] [phone] [address] [place] [phone]        [facility] Follow up on 03/31/2017.   Why:  at 3:30pm with Dr. Jannifer Franklin for medication management.  Contact information: [address] [place], [place] [address] [phone] Fax: [phone]          Next level of care provider has access to [facility] Link:no  Safety Planning and Suicide Prevention discussed: Yes,  with pt and with pt's mother.  Have you used any form of tobacco in the last 30 days? (Cigarettes, Smokeless Tobacco, Cigars, and/or Pipes): Yes  Has patient been referred to the Quitline?: Patient refused referral  Patient has been referred for addiction treatment: Yes  Amber York 03/02/2017, 11:50 AM

## 2017-03-02 NOTE — Discharge Summary (Signed)
Physician Discharge Summary Note  Patient:  Amber York is an 21 y.o., female MRN:  [id number] DOB:  Apr 01, 1996 Patient phone:  [phone] (home)  Patient address:   [address]  [place] [address],  Total Time spent with patient: Greater than 30 minutes  Date of Admission:  02/24/2017 Date of Discharge: 03-02-17  Reason for Admission: Worsening depression & suicidal ideations.  Principal Problem: MDD (major depressive disorder)  Discharge Diagnoses: Patient Active Problem List   Diagnosis Date Noted  . 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 MDD (major depressive disorder) [F32.9] 02/24/2017   Past Psychiatric History: Major depressive disorder.  Past Medical History:  Past Medical History:  Diagnosis Date  . Anxiety   . Depression    History reviewed. No pertinent surgical history. Family History: History reviewed. No pertinent family history.  Family Psychiatric  History: See H&P  Social History:  History  Alcohol Use No     History  Drug Use No    Social History   Social History  . Marital status: Single    Spouse name: N/A  . Number of children: N/A  . Years of education: N/A   Social History Main Topics  . Smoking status: Never Smoker  . Smokeless tobacco: Never Used  . Alcohol use No  . Drug use: No  . Sexual activity: No   Other Topics Concern  . None   Social History Narrative  . None   Hospital Course: Amber York 20 year AA female who resides in [place] and moved to [place] to attend [employer]. SHe is a sophomore and has a B average at this time. SHe is studying for a business major. SHe has no family in this area, and identifies her mother as her closest support system. She is single and unemployed due to student status. She resides in the dormitory of the school, and will be going home over summer break. She reports, "I been suicidal for a week. I didn't  want to be here anymore. I called a friend back home and told him I was going to die soon. He called my friends here at the school, and they took me to the college health center. They made me come here I had no choice. I been dealing with depression for about a year. It comes from my low self-esteem, school, money and being away from home. I moved here for school about a year ago.   Amber York was admitted to the hospital for worsening symptoms of depression & crisis management due to suicidal ideations. She is a Consulting civil engineer at the [employer]. She had apparently called a friend at her home in [place] telling him that she was going to die soon. She cited being away from home, financial stressors & worsening depression as the triggers. She was admitted for mood stabilization treatments.   After her admission assessment, Pat's presenting symptoms were identified. The medication regimen targeting those symptoms were initiated. She was medicated & discharged on; Sertraline 5 mg for depression & Trazodone 50 mg for insomnia. She presented no other pre-existing medical problems that required treatment. [name] was enrolled & participated in the group counseling sessions being offered & held on this unit. She learned coping skills.  During the course of her hospitalization, Amber York's improvement was monitored by observation & her daily report of symptom reduction noted. Her emotional and mental status were monitored by daily self-inventory reports completed  by her & the clinical staff. She was evaluated daily by the treatment team for mood stability and plans for continued recovery after discharge. She was offered further treatment options upon discharge on an outpatient basis as noted below.   Upon discharge, Amber York was both mentally and medically stable. She denies suicidal/homicidal ideations, auditory/visual/tactile hallucinations, delusional thoughts or paranoia. She received from the  pharmacy, a 7 days worth, supply samples of her [facility] discharge medications. She left [facility] with all belongings in no distress. Transportation per patient's arrangement.  Physical Findings: AIMS: Facial and Oral Movements Muscles of Facial Expression: None, normal Lips and Perioral Area: None, normal Jaw: None, normal Tongue: None, normal,Extremity Movements Upper (arms, wrists, hands, fingers): None, normal Lower (legs, knees, ankles, toes): None, normal, Trunk Movements Neck, shoulders, hips: None, normal, Overall Severity Severity of abnormal movements (highest score from questions above): None, normal Incapacitation due to abnormal movements: None, normal Patient's awareness of abnormal movements (rate only patient's report): No Awareness, Dental Status Current problems with teeth and/or dentures?: No Does patient usually wear dentures?: No  CIWA:  CIWA-Ar Total: 0 COWS:  COWS Total Score: 0  Musculoskeletal: Strength & Muscle Tone: within normal limits Gait & Station: normal Patient leans: N/A  Psychiatric Specialty Exam: Physical Exam  Constitutional: She appears well-developed.  HENT:  Head: Normocephalic.  Eyes: Pupils are equal, round, and reactive to light.  Neck: Normal range of motion.  Cardiovascular: Normal rate.   Respiratory: Effort normal.  GI: Soft.  Genitourinary:  Genitourinary Comments: Deferred  Musculoskeletal: Normal range of motion.  Neurological: She is alert.  Skin: Skin is warm.    Review of Systems  Constitutional: Negative.   HENT: Negative.   Eyes: Negative.   Respiratory: Negative.   Cardiovascular: Negative.   Gastrointestinal: Negative.   Genitourinary: Negative.   Musculoskeletal: Negative.   Skin: Negative.   Neurological: Negative.   Endo/Heme/Allergies: Negative.   Psychiatric/Behavioral: Positive for depression (Stable). Negative for hallucinations, memory loss, substance abuse and suicidal ideas. The patient has insomnia  (Stable). The patient is not nervous/anxious.     Blood pressure 116/70, pulse 76, temperature 98.3 F (36.8 C), temperature source Oral, resp. rate 16, height  (1.676 m), weight 69.4 kg (153 lb), last menstrual period 01/29/2017, SpO2 99 %.Body mass index is 24.69 kg/m.  See Md's SRA   Have you used any form of tobacco in the last 30 days? (Cigarettes, Smokeless Tobacco, Cigars, and/or Pipes): Yes  Has this patient used any form of tobacco in the last 30 days? (Cigarettes, Smokeless Tobacco, Cigars, and/or Pipes): No  Blood Alcohol level:  Lab Results  Component Value Date   [facility] <5 02/23/2017   ETH <5 09/04/2015   Metabolic Disorder Labs:  Lab Results  Component Value Date   HGBA1C 5.1 02/26/2017   MPG 100 02/26/2017   No results found for: PROLACTIN Lab Results  Component Value Date   CHOL 156 02/26/2017   TRIG 53 02/26/2017   HDL 55 02/26/2017   CHOLHDL 2.8 02/26/2017   VLDL 11 02/26/2017   LDLCALC 90 02/26/2017   See Psychiatric Specialty Exam and Suicide Risk Assessment completed by Attending Physician prior to discharge.  Discharge destination:  Home  Is patient on multiple antipsychotic therapies at discharge:  No   Has Patient had three or more failed trials of antipsychotic monotherapy by history:  No  Recommended Plan for Multiple Antipsychotic Therapies: NA  Allergies as of 03/02/2017      Reactions  Wheat Bran Hives, Shortness Of Breath   Other    Pea's, hives/sob   Peanuts [peanut Oil] Hives      Medication List    STOP taking these medications   cetirizine 10 MG tablet Commonly known as:  ZYRTEC   hydrocortisone cream 0.5 %   triamcinolone 55 MCG/ACT Aero nasal inhaler Commonly known as:  NASACORT     TAKE these medications     Indication  sertraline 50 MG tablet Commonly known as:  ZOLOFT Take 1 tablet (50 mg total) by mouth daily. For depression Start taking on:  03/03/2017  Indication:  Major Depressive Disorder   traZODone 50  MG tablet Commonly known as:  DESYREL Take 1 tablet (50 mg total) by mouth at bedtime as needed for sleep.  Indication:  Trouble Sleeping      Follow-up Information    [facility] Follow up on 03/03/2017.   Why:  at 10:00am for therapy with Dr. Bary Richard. Contact information: Lucy [name], [phone] [place] [phone]         [facility] Follow up on 03/31/2017.   Why:  at 3:30pm with Dr. Jannifer Franklin for medication management.  Contact information: [address] [place], [place] [address] [phone] Fax: [phone]         Follow-up recommendations: Activity:  As tolerated Diet: As recommended by your primary care doctor. Keep all scheduled follow-up appointments as recommended.   Comments: Patient is instructed prior to discharge to: Take all medications as prescribed by his/her mental healthcare provider. Report any adverse effects and or reactions from the medicines to his/her outpatient provider promptly. Patient has been instructed & cautioned: To not engage in alcohol and or illegal drug use while on prescription medicines. In the event of worsening symptoms, patient is instructed to call the crisis hotline, 911 and or go to the nearest ED for appropriate evaluation and treatment of symptoms. To follow-up with his/her primary care provider for your other medical issues, concerns and or health care needs.   Signed: Sanjuana Kava, NP, PMHNP, FNP-BC 03/02/2017, 12:39 PM   Patient seen, Suicide Assessment Completed.  Disposition Plan Reviewed

## 2017-03-02 NOTE — Progress Notes (Signed)
Discharge note: Pt received both written and verbal discharge instructions. Pt verbalized understanding of discharge instructions. Pt agreed to f/u appt and med regimen. Pt received sample meds, prescriptions, AVS, SRA and transitional record. Pt gathered belongings from room and locker. Pt safely discharged to the lobby. Pt was given a bus pass and directions for public transportation.

## 2017-09-30 ENCOUNTER — Encounter (HOSPITAL_COMMUNITY): Payer: Self-pay | Admitting: Nurse Practitioner

## 2017-09-30 ENCOUNTER — Emergency Department (HOSPITAL_COMMUNITY)
Admission: EM | Admit: 2017-09-30 | Discharge: 2017-09-30 | Disposition: A | Discharge status: Home / Self Care | Payer: Medicaid - Out of State | Attending: Emergency Medicine | Admitting: Emergency Medicine

## 2017-09-30 ENCOUNTER — Emergency Department (HOSPITAL_COMMUNITY): Payer: Medicaid - Out of State

## 2017-09-30 DIAGNOSIS — R079 Chest pain, unspecified: Secondary | ICD-10-CM | POA: Diagnosis present

## 2017-09-30 DIAGNOSIS — R0789 Other chest pain: Secondary | ICD-10-CM | POA: Diagnosis not present

## 2017-09-30 DIAGNOSIS — Z79899 Other long term (current) drug therapy: Secondary | ICD-10-CM | POA: Diagnosis not present

## 2017-09-30 LAB — I-STAT TROPONIN, ED
TROPONIN I, POC: 0 ng/mL (ref 0.00–0.08)
Troponin i, poc: 0 ng/mL (ref 0.00–0.08)

## 2017-09-30 LAB — I-STAT CHEM 8, ED
BUN: 15 mg/dL (ref 6–20)
Calcium, Ion: 1.25 mmol/L (ref 1.15–1.40)
Chloride: 105 mmol/L (ref 101–111)
Creatinine, Ser: 0.8 mg/dL (ref 0.44–1.00)
Glucose, Bld: 90 mg/dL (ref 65–99)
HEMATOCRIT: 39 % (ref 36.0–46.0)
Hemoglobin: 13.3 g/dL (ref 12.0–15.0)
Potassium: 3.9 mmol/L (ref 3.5–5.1)
SODIUM: 138 mmol/L (ref 135–145)
TCO2: 23 mmol/L (ref 22–32)

## 2017-09-30 MED ORDER — GI COCKTAIL ~~LOC~~
30.0000 mL | Freq: Once | ORAL | Status: AC
  Administered 2017-09-30: 30 mL via ORAL
  Filled 2017-09-30: qty 30

## 2017-09-30 MED ORDER — RANITIDINE HCL 150 MG PO CAPS: 150.0000 mg | ORAL_CAPSULE | Freq: Every day | ORAL | 0 refills | Status: AC

## 2017-09-30 NOTE — ED Provider Notes (Signed)
[facility]-EMERGENCY DEPT Provider Note   CSN: [id number] Arrival date & time: 09/30/17  1726     History   Chief Complaint No chief complaint on file.   HPI Amber York is a 21 y.o. female.  HPI   21 year old female with history of anxiety and depression presenting for evaluation of  with chest pain.  Patient report approximately 6 hours ago she developed pain to her mid sternal region.  Pain is described as a pressure sharp sensation with abnormal taste in her throat and throat discomfort.  She did endorse some mild shortness of breath at that time.  She denies any sensation of lightheadedness, dizziness, diaphoresis, exertional chest pain.  Her pain has since improved and currently rated as 4 out of 10.  She believes it may be heartburn.  She denies history of heartburn in the past.  She denies any significant family history of cardiac disease, no premature cardiac death, she is not a smoker, no history of hypertension or diabetes.  She denies any specific treatment tried.  She denies feeling anxious or having a panic attack.  No change in diet, no recent strenuous activities or heavy lifting.  Furthermore, no prior history of PE or DVT, no recent surgery, prolonged bedrest, your leg swelling or calf pain, active cancer or hemoptysis.  She is not on any oral hormone.  Past Medical History:  Diagnosis Date  . Anxiety   . Depression     Patient Active Problem List   Diagnosis Date Noted  . Major depressive disorder, single episode, severe without psychotic features (HCC) 02/24/2017  . MDD (major depressive disorder) 02/24/2017    History reviewed. No pertinent surgical history.  OB History    No data available       Home Medications    Prior to Admission medications   Medication Sig Start Date End Date Taking? Authorizing Provider  sertraline (ZOLOFT) 50 MG tablet Take 1 tablet (50 mg total) by mouth daily. For depression 03/03/17   Armandina StammerNwoko, Agnes  I, NP  traZODone (DESYREL) 50 MG tablet Take 1 tablet (50 mg total) by mouth at bedtime as needed for sleep. 03/02/17   Sanjuana KavaNwoko, Agnes I, NP    Family History History reviewed. No pertinent family history.  Social History Social History   Tobacco Use  . Smoking status: Never Smoker  . Smokeless tobacco: Never Used  Substance Use Topics  . Alcohol use: No  . Drug use: No     Allergies   Wheat bran; Other; and Peanuts [peanut oil]   Review of Systems Review of Systems  All other systems reviewed and are negative.    Physical Exam Updated Vital Signs BP (!) 143/75 (BP Location: Right Arm)   Pulse 62   Temp 98.3 F (36.8 C) (Oral)   Resp 18   SpO2 100%   Physical Exam  Constitutional: She is oriented to person, place, and time. She appears well-developed and well-nourished. No distress.  HENT:  Head: Atraumatic.  Eyes: Conjunctivae are normal.  Neck: Neck supple.  Cardiovascular: Normal rate, regular rhythm and intact distal pulses.  Pulmonary/Chest: Effort normal and breath sounds normal.  Abdominal: Soft. Bowel sounds are normal. She exhibits no distension. There is no tenderness.  Musculoskeletal: She exhibits no edema.  Neurological: She is alert and oriented to person, place, and time.  Skin: No rash noted.  Psychiatric: She has a normal mood and affect.  Nursing note and vitals reviewed.    ED Treatments /  Results  Labs (all labs ordered are listed, but only abnormal results are displayed) Labs Reviewed  I-STAT CHEM 8, ED  I-STAT TROPONIN, ED  I-STAT TROPONIN, ED    EKG  EKG Interpretation  Date/Time:  Saturday September 30 2017 17:42:25 EST Ventricular Rate:  58 PR Interval:    QRS Duration: 68 QT Interval:  381 QTC Calculation: 375 R Axis:   71 Text Interpretation:  Sinus rhythm ST elev, probable normal early repol pattern When compared to prior, t wave inversion in lead V2.  No STEMI Confirmed by [name], [name] ([id number]) on 09/30/2017 9:21:20  PM     ED ECG REPORT   Date: 09/30/2017  Rate: 58  Rhythm: normal sinus rhythm  QRS Axis: normal  Intervals: normal  ST/T Wave abnormalities: early repolarization  Conduction Disutrbances:none  Narrative Interpretation:   Old EKG Reviewed: unchanged  I have personally reviewed the EKG tracing and agree with the computerized printout as noted.   Radiology Dg Chest 2 View  Result Date: 09/30/2017 CLINICAL DATA:  Shortness of breath and mid chest pain. EXAM: CHEST  2 VIEW COMPARISON:  None. FINDINGS: The heart size and mediastinal contours are within normal limits. Both lungs are clear. The visualized skeletal structures are unremarkable. IMPRESSION: No active cardiopulmonary disease. Electronically Signed   By: Gerome Samavid  Williams III M.D   On: 09/30/2017 20:50    Procedures Procedures (including critical care time)  Medications Ordered in ED Medications  gi cocktail (Maalox,Lidocaine,Donnatal) (30 mLs Oral Given 09/30/17 2036)     Initial Impression / Assessment and Plan / ED Course  I have reviewed the triage vital signs and the nursing notes.  Pertinent labs & imaging results that were available during my care of the patient were reviewed by me and considered in my medical decision making (see chart for details).    BP 122/69 (BP Location: Left Arm)   Pulse (!) 53   Temp 98.5 F (36.9 C) (Oral)   Resp 20   LMP 09/03/2017   SpO2 100%    Final Clinical Impressions(s) / ED Diagnoses   Final diagnoses:  Atypical chest pain    ED Discharge Orders        Ordered    ranitidine (ZANTAC) 150 MG capsule  Daily     09/30/17 2225     8:23 PM Patient here with midsternal chest pain atypical for ACS. PERC negative, doubt PE.  Suspect GERD/Gastritis.  Will obtain EKG, screening CXR, and give GI cocktail.   8:37 PM EKG show ST elevation in the inferior leads likely early re-pol pattern. Will perform screening labs, trop, cxr.  10:06 PM Initial troponin is normal, labs  are reassuring, chest x-ray unremarkable, and patient felt much better after receiving GI cocktail.  Although this is likely GERD, will obtain a delta troponin prior to discharge.  10:53 PM Negative delta troponin, pt stable for discharge with sxs treatment.  Return precaution given.    Fayrene Helperran, Shantee Hayne, PA-C 09/30/17 2253    Tegeler, Canary Brimhristopher J, MD 10/01/17 [phone]

## 2017-09-30 NOTE — ED Triage Notes (Signed)
Pt is c/o chest and throat pain that started about 2 hours ago. Also reporting shortness of breath earlier, denies any other symptoms.

## 2017-09-30 NOTE — ED Notes (Signed)
Patient transported to X-ray 

## 2018-11-23 IMAGING — CR DG CHEST 2V
2 series · 2 of 2 positions shown · non-contrast
Comparison: None.

CLINICAL DATA: Shortness of breath and mid chest pain.

EXAM:
CHEST  2 VIEW

[w chest pa]
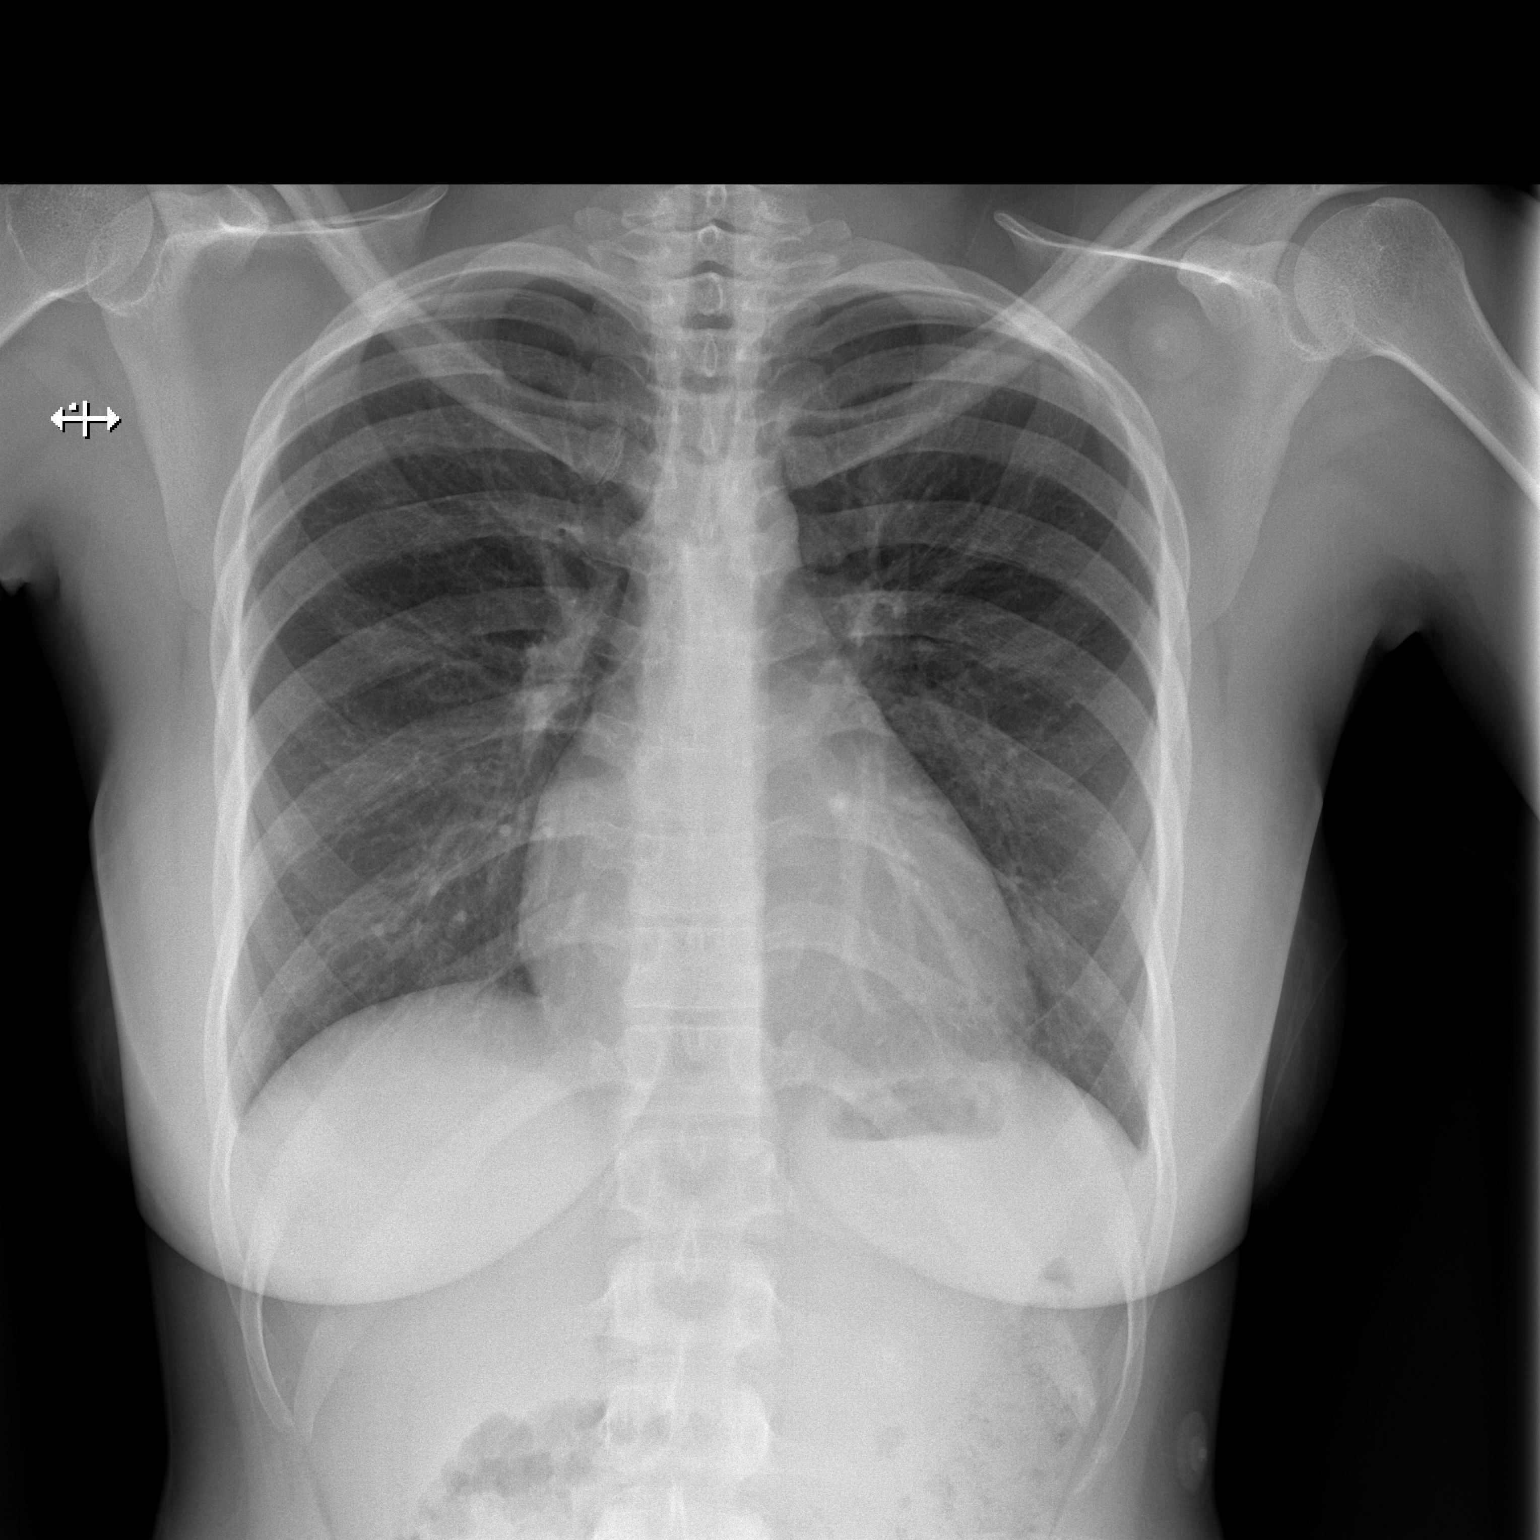

[w chest lat]
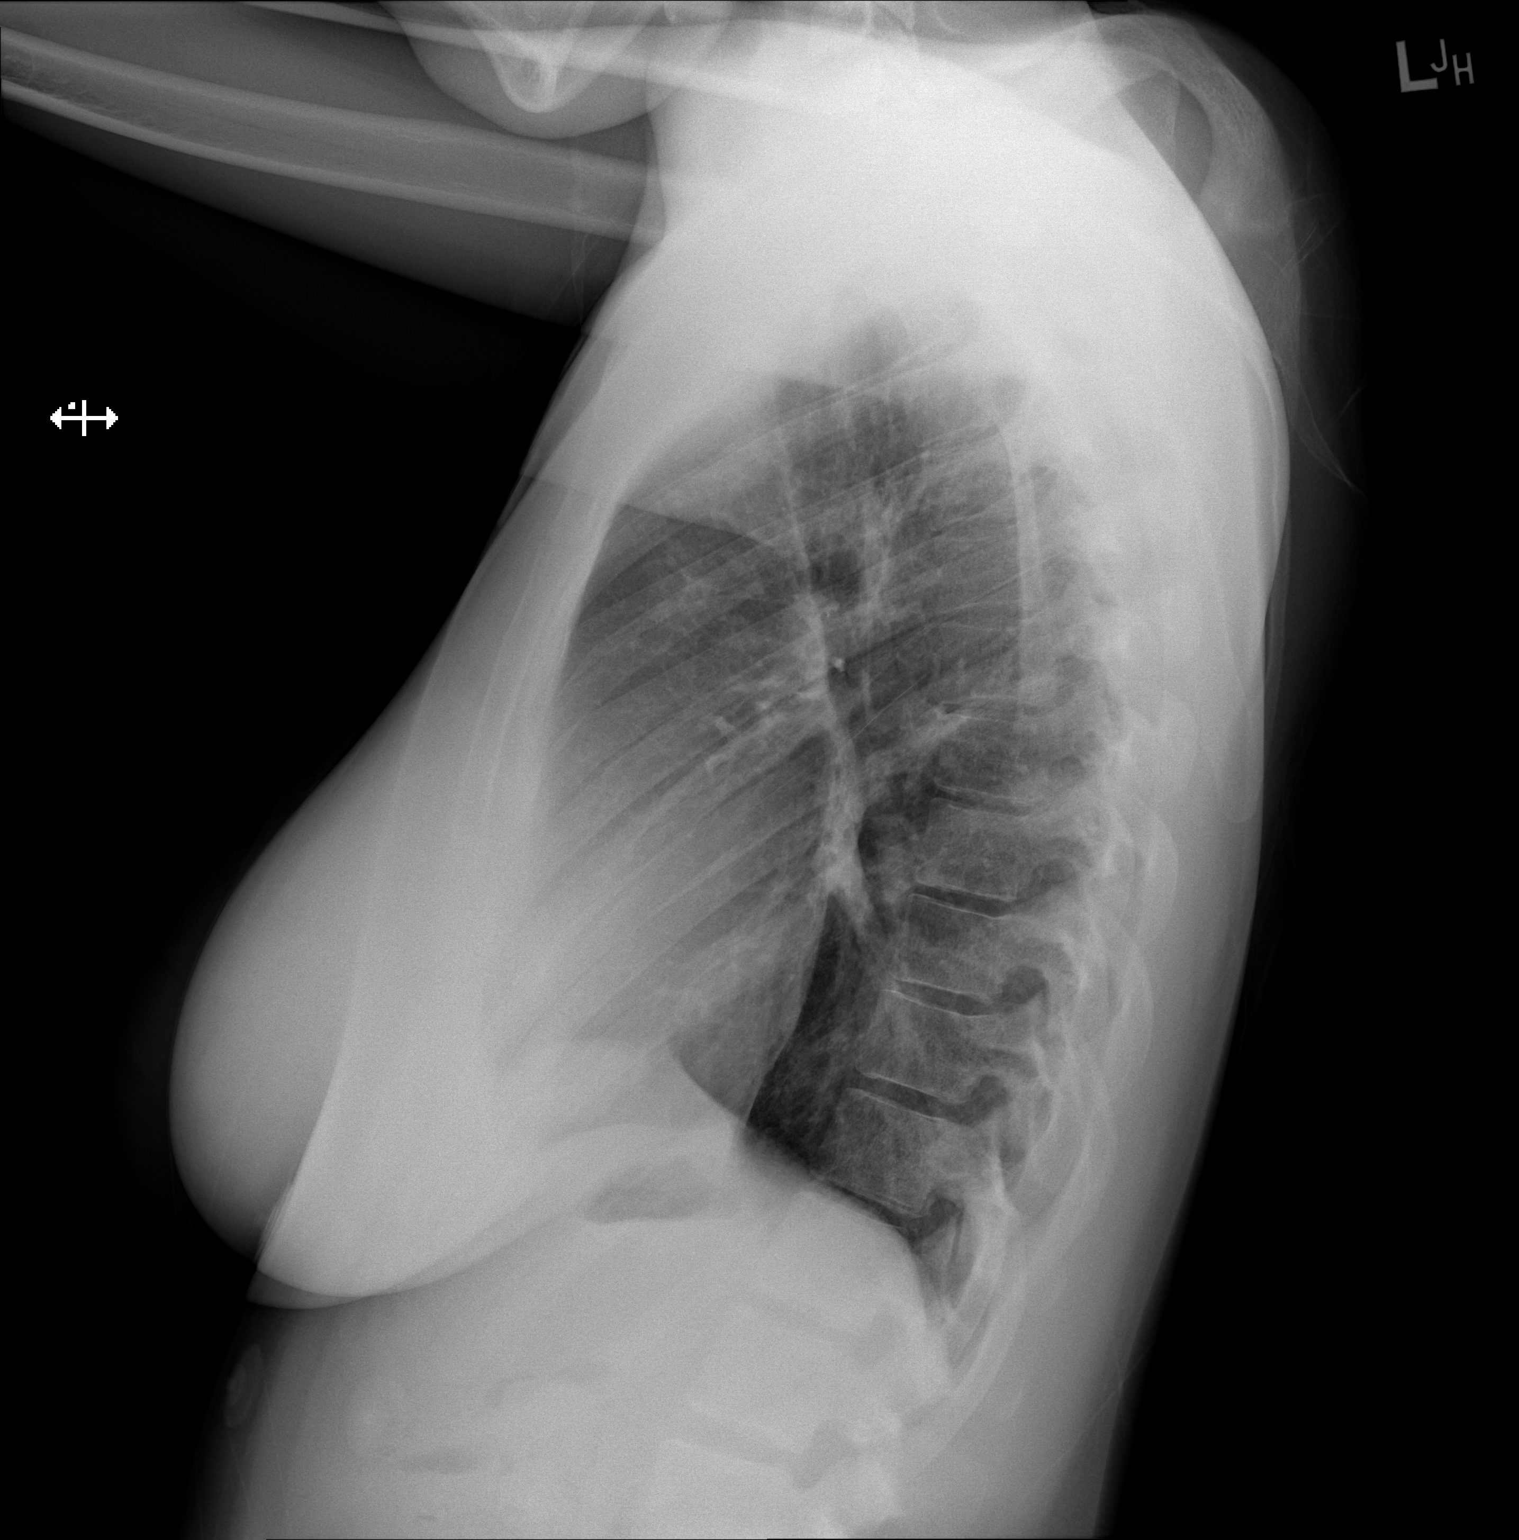

[2 of 2 positions shown; findings below may reference images not displayed]

FINDINGS: The heart size and mediastinal contours are within normal limits.
Both lungs are clear. The visualized skeletal structures are
unremarkable.
IMPRESSION: No active cardiopulmonary disease.
